# Patient Record
Sex: Female | Born: 1995 | Race: Black or African American | Hispanic: No | Marital: Single | Smoking: Never smoker
Health system: Southern US, Community
[De-identification: ages and names within clinical notes are randomized; demographics above are authoritative.]

## PROBLEM LIST (undated history)

## (undated) DIAGNOSIS — N926 Irregular menstruation, unspecified: Secondary | ICD-10-CM

---

## 2015-05-04 ENCOUNTER — Emergency Department (HOSPITAL_COMMUNITY)
Admission: EM | Admit: 2015-05-04 | Discharge: 2015-05-04 | Disposition: A | Discharge status: Home / Self Care | Payer: Medicaid Other | Attending: Emergency Medicine | Admitting: Emergency Medicine

## 2015-05-04 ENCOUNTER — Encounter (HOSPITAL_COMMUNITY): Payer: Self-pay | Admitting: Emergency Medicine

## 2015-05-04 DIAGNOSIS — N644 Mastodynia: Secondary | ICD-10-CM | POA: Insufficient documentation

## 2015-05-04 MED ORDER — NAPROXEN 500 MG PO TABS: 500.0000 mg | ORAL_TABLET | Freq: Two times a day (BID) | ORAL | Status: DC

## 2015-05-04 MED ORDER — CEPHALEXIN 500 MG PO CAPS: 500.0000 mg | ORAL_CAPSULE | Freq: Four times a day (QID) | ORAL | Status: DC

## 2015-05-04 MED ORDER — NAPROXEN 500 MG PO TABS
500.0000 mg | ORAL_TABLET | Freq: Once | ORAL | Status: AC
  Administered 2015-05-04: 500 mg via ORAL
  Filled 2015-05-04: qty 1
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
## 2015-05-04 NOTE — Discharge Instructions (Signed)
Breast Tenderness Follow up with [facility], women's outpatient clinic, or use the resource guide below to find a primary physician. Breast tenderness is a common problem for women of all ages. Breast tenderness may cause mild discomfort to severe pain. It has a variety of causes. Your health care provider will find out the likely cause of your breast tenderness by examining your breasts, asking you about symptoms, and ordering some tests. Breast tenderness usually does not mean you have breast cancer. HOME CARE INSTRUCTIONS  Breast tenderness often can be handled at home. You can try:  Getting fitted for a new bra that provides more support, especially during exercise.  Wearing a more supportive bra or sports bra while sleeping when your breasts are very tender.  If you have a breast injury, apply ice to the area:  Put ice in a plastic bag.  Place a towel between your skin and the bag.  Leave the ice on for 20 minutes, 2-3 times a day.  If your breasts are too full of milk as a result of breastfeeding, try:  Expressing milk either by hand or with a breast pump.  Applying a warm compress to the breasts for relief.  Taking over-the-counter pain relievers, if approved by your health care provider.  Taking other medicines that your health care provider prescribes. These may include antibiotic medicines or birth control pills. Over the long term, your breast tenderness might be eased if you:  Cut down on caffeine.  Reduce the amount of fat in your diet. Keep a log of the days and times when your breasts are most tender. This will help you and your health care provider find the cause of the tenderness and how to relieve it. Also, learn how to do breast exams at home. This will help you notice if you have an unusual growth or lump that could cause tenderness. SEEK MEDICAL CARE IF:   Any part of your breast is hard, red, and hot to the touch. This could be a sign of  infection.  Fluid is coming out of your nipples (and you are not breastfeeding). Especially watch for blood or pus.  You have a fever as well as breast tenderness.  You have a new or painful lump in your breast that remains after your menstrual period ends.  You have tried to take care of the pain at home, but it has not gone away.  Your breast pain is getting worse, or the pain is making it hard to do the things you usually do during your day. Document Released: 07/20/2008 Document Revised: 04/09/2013 Document Reviewed: 03/06/2013 [facility] Patient Information 2015 [place], [place]. This information is not intended to replace advice given to you by your health care provider. Make sure you discuss any questions you have with your health care provider.
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
## 2015-05-04 NOTE — ED Notes (Signed)
Pt complains of left breast pain. Tender to touch.

## 2015-07-22 ENCOUNTER — Emergency Department (HOSPITAL_COMMUNITY)
Admission: EM | Admit: 2015-07-22 | Discharge: 2015-07-22 | Disposition: A | Discharge status: Home / Self Care | Payer: Medicaid Other | Attending: Emergency Medicine | Admitting: Emergency Medicine

## 2015-07-22 ENCOUNTER — Encounter (HOSPITAL_COMMUNITY): Payer: Self-pay

## 2015-07-22 DIAGNOSIS — Z792 Long term (current) use of antibiotics: Secondary | ICD-10-CM | POA: Diagnosis not present

## 2015-07-22 DIAGNOSIS — Z79899 Other long term (current) drug therapy: Secondary | ICD-10-CM | POA: Diagnosis not present

## 2015-07-22 DIAGNOSIS — Z3202 Encounter for pregnancy test, result negative: Secondary | ICD-10-CM | POA: Insufficient documentation

## 2015-07-22 DIAGNOSIS — R110 Nausea: Secondary | ICD-10-CM

## 2015-07-22 DIAGNOSIS — Z791 Long term (current) use of non-steroidal anti-inflammatories (NSAID): Secondary | ICD-10-CM | POA: Insufficient documentation

## 2015-07-22 DIAGNOSIS — N912 Amenorrhea, unspecified: Secondary | ICD-10-CM

## 2015-07-22 DIAGNOSIS — I10 Essential (primary) hypertension: Secondary | ICD-10-CM | POA: Insufficient documentation

## 2015-07-22 DIAGNOSIS — R1030 Lower abdominal pain, unspecified: Secondary | ICD-10-CM | POA: Diagnosis present

## 2015-07-22 HISTORY — DX: Irregular menstruation, unspecified: N92.6

## 2015-07-22 LAB — URINALYSIS, ROUTINE W REFLEX MICROSCOPIC
Bilirubin Urine: NEGATIVE
GLUCOSE, UA: NEGATIVE mg/dL
HGB URINE DIPSTICK: NEGATIVE
Ketones, ur: NEGATIVE mg/dL
Leukocytes, UA: NEGATIVE
Nitrite: NEGATIVE
Protein, ur: NEGATIVE mg/dL
SPECIFIC GRAVITY, URINE: 1.018 (ref 1.005–1.030)
pH: 6.5 (ref 5.0–8.0)

## 2015-07-22 LAB — POC URINE PREG, ED: Preg Test, Ur: NEGATIVE

## 2015-07-22 MED ORDER — IBUPROFEN 800 MG PO TABS: 800.0000 mg | ORAL_TABLET | Freq: Three times a day (TID) | ORAL | Status: DC

## 2015-07-22 NOTE — Discharge Instructions (Signed)
°Menstruation °Menstruation is the monthly passing of blood, tissue, fluid, and mucus. It is also known as a period. Your body is shedding the lining of the uterus. The flow of blood usually occurs during 3-7 consecutive days each month. Hormones control the menstrual cycle. Hormones are a chemical substance produced by endocrine glands in the body to regulate different bodily functions. °The first menstrual period may start any time between age 20 years to 16 years. However, it usually starts around age 12 years. Some girls have regular monthly menstrual cycles right from the beginning. However, it is not unusual to have only a couple of drops of blood or spotting when you first start menstruating. It is also not unusual to have two periods a month or miss a month or two when first starting your periods. °SYMPTOMS  °· Mild to moderate abdominal cramps. °· Aching or pain in the lower back area. °Symptoms may occur 5-10 days before your menstrual period starts. These symptoms are referred to as premenstrual syndrome (PMS). These symptoms can include: °· Headache. °· Breast tenderness and swelling. °· Bloating. °· Tiredness (fatigue). °· Mood changes. °· Craving for certain foods. °These are normal signs and symptoms and can vary in severity. To help relieve these problems, ask your caregiver if you can take over-the-counter medications for pain or discomfort. If the symptoms are not controllable, see your caregiver for help.  °HORMONES INVOLVED IN MENSTRUATION °Menstruation comes about because of hormones produced by the pituitary gland in the brain and the ovaries that affect the uterine lining. °First, the pituitary gland in the brain produces the hormone follicle stimulating hormone (FSH). FSH stimulates the ovaries to produce estrogen, which thickens the uterine lining and begins to develop an egg in the ovary. About 14 days later, the pituitary gland produces another hormone called luteinizing hormone (LH). LH  causes the egg to come out of a sac in the ovary (ovulation). The empty sac on the ovary called the corpus luteum is stimulated by another hormone from the pituitary gland called luteotropin. The corpus luteum begins to produce the estrogen and progesterone hormone. The progesterone hormone prepares the lining of the uterus to have the fertilized egg (egg combined with sperm) attach to the lining of the uterus and begin to develop into a fetus. If the egg is not fertilized, the corpus luteum stops producing estrogen and progesterone, it disappears, the lining of the uterus sloughs off and a menstrual period begins. Then the menstrual cycle starts all over again and will continue monthly unless pregnancy occurs or menopause begins. °The secretion of hormones is complex. Various parts of the body become involved in many chemical activities. Female sex hormones have other functions in a woman's body as well. Estrogen increases a woman's sex drive (libido). It naturally helps body get rid of fluids (diuretic). It also aids in the process of building new bone. Therefore, maintaining hormonal health is essential to all levels of a woman's well being. These hormones are usually present in normal amounts and cause you to menstruate. It is the relationship between the (small) levels of the hormones that is critical. When the balance is upset, menstrual irregularities can occur. °HOW DOES THE MENSTRUAL CYCLE HAPPEN? °· Menstrual cycles vary in length from 21-35 days with an average of 29 days. The cycle begins on the first day of bleeding. At this time, the pituitary gland in the brain releases FSH that travels through the bloodstream to the ovaries. The FSH stimulates the follicles in   the ovaries. This prepares the body for ovulation that occurs around the 14th day of the cycle. The ovaries produce estrogen, and this makes sure conditions are right in the uterus for implantation of the fertilized egg. °· When the levels of  estrogen reach a high enough level, it signals the gland in the brain (pituitary gland) to release a surge of LH. This causes the release of the ripest egg from its follicle (ovulation). Usually only one follicle releases one egg, but sometimes more than one follicle releases an egg especially when stimulating the ovaries for in vitro fertilization. The egg can then be collected by either fallopian tube to await fertilization. The burst follicle within the ovary that is left behind is now called the corpus luteum or "yellow body." The corpus luteum continues to give off (secrete) reduced amounts of estrogen. This closes and hardens the cervix. It dries up the mucus to the naturally infertile condition. °· The corpus luteum also begins to give off greater amounts of progesterone. This causes the lining of the uterus (endometrium) to thicken even more in preparation for the fertilized egg. The egg is starting to journey down from the fallopian tube to the uterus. It also signals the ovaries to stop releasing eggs. It assists in returning the cervical mucus to its infertile state. °· If the egg implants successfully into the womb lining and pregnancy occurs, progesterone levels will continue to raise. It is often this hormone that gives some pregnant women a feeling of well being, like a "natural high." Progesterone levels drop again after childbirth. °· If fertilization does not occur, the corpus luteum dies, stopping the production of hormones. This sudden drop in progesterone causes the uterine lining to break down, accompanied by blood (menstruation). °· This starts the cycle back at day 1. The whole process starts all over again. Woman go through this cycle every month from puberty to menopause. Women have breaks only for pregnancy and breastfeeding (lactation), unless the woman has health problems that affect the female hormone system or chooses to use oral contraceptives to have unnatural menstrual  periods. °HOME CARE INSTRUCTIONS  °· Keep track of your periods by using a calendar. °· If you use tampons, get the least absorbent to avoid toxic shock syndrome. °· Do not leave tampons in the vagina over night or longer than 6 hours. °· Wear a sanitary pad over night. °· Exercise 3-5 times a week or more. °· Avoid foods and drinks that you know will make your symptoms worse before or during your period. °SEEK MEDICAL CARE IF:  °· You develop a fever with your period. °· Your periods are lasting more than 7 days. °· Your period is so heavy that you have to change pads or tampons every 30 minutes. °· You develop clots with your period and never had clots before. °· You cannot get relief from over-the-counter medication for your symptoms. °· Your period has not started, and it has been longer than 35 days. °  °This information is not intended to replace advice given to you by your health care provider. Make sure you discuss any questions you have with your health care provider. °  °Document Released: 07/28/2002 Document Revised: 04/28/2015 Document Reviewed: 03/06/2013 °Elsevier Interactive Patient Education ©2016 [employer] ° ° °

## 2015-07-22 NOTE — ED Provider Notes (Signed)
CSN: [id number]     Arrival date & time 07/22/15  1148 History   First MD Initiated Contact with Patient 07/22/15 1237     Chief Complaint  Patient presents with  . Nausea    X3 DAYS  . Abdominal Pain    SINCE THIS AM     (Consider location/radiation/quality/duration/timing/severity/associated sxs/prior Treatment) HPI Patient poor she's been having intermittent nausea. She also reports having missed periods. She reports she's had intermittent cramping lower abdominal pain. She denies abnormal vaginal discharge. Past Medical History  Diagnosis Date  . Hypertension   . Irregular menses    History reviewed. No pertinent past surgical history. History reviewed. No pertinent family history. Social History  Substance Use Topics  . Smoking status: Never Smoker   . Smokeless tobacco: None  . Alcohol Use: No   OB History    No data available     Review of Systems Constitutional: No fevers no chills Respiratory: No cough or short of breath GU: Frequent urination no abnormal vaginal discharge.   Allergies  Review of patient's allergies indicates no known allergies.  Home Medications   Prior to Admission medications   Medication Sig Start Date End Date Taking? Authorizing Provider  BIOTIN PO Take 1 tablet by mouth daily.   Yes Historical Provider, MD  cephALEXin (KEFLEX) 500 MG capsule Take 1 capsule (500 mg total) by mouth 4 (four) times daily. 05/04/15   Hanna Patel-Mills, PA-C  ibuprofen (ADVIL,MOTRIN) 800 MG tablet Take 1 tablet (800 mg total) by mouth 3 (three) times daily. 07/22/15   Arby BarretteMarcy Gloria Lambertson, MD  naproxen (NAPROSYN) 500 MG tablet Take 1 tablet (500 mg total) by mouth 2 (two) times daily. 05/04/15   Hanna Patel-Mills, PA-C   BP 117/95 mmHg  Pulse 86  Temp(Src) 98.3 F (36.8 C) (Oral)  Resp 18  SpO2 100%  LMP 04/12/2015 Physical Exam  Constitutional:  Patient is morbidly obese and she is alert and nontoxic. No acute distress.  HENT:  Head: Normocephalic and  atraumatic.  Eyes: EOM are normal.  Cardiovascular: Normal rate, normal heart sounds and intact distal pulses.   Pulmonary/Chest: Effort normal and breath sounds normal.  Abdominal: Soft. Bowel sounds are normal. She exhibits no distension. There is no tenderness.  Musculoskeletal: Normal range of motion.  Neurological: She is alert. Coordination normal.  Skin: Skin is warm and dry.  Psychiatric: She has a normal mood and affect.    ED Course  Procedures (including critical care time) Labs Review Labs Reviewed  URINALYSIS, ROUTINE W REFLEX MICROSCOPIC (NOT [facility])  POC URINE PREG, ED    Imaging Review No results found. I have personally reviewed and evaluated these images and lab results as part of my medical decision-making.   EKG Interpretation None      MDM   Final diagnoses:  Amenorrhea  Nausea   Patient was her main reason coming to the emergency department with concern for pregnancy. She declines pelvic examination at this time. She does not express concern for STD. She reports she can follow up the health department for that.    Arby BarretteMarcy Betzabe Bevans, MD 07/22/15 1341

## 2015-07-22 NOTE — ED Notes (Signed)
PT C/O NAUSEA X3 DAYS. PT ALSO C/O LOWER ABDOMINAL CRAMPING SINCE THIS AM WITH DIARRHEA. PT STATES SHE HAS MISSED HER MENSTRUAL FOR OVER A MONTH. ALSO C/O FREQUENT URINATION. DENIES VAGINAL BLEEDING OR D/C.

## 2015-09-05 ENCOUNTER — Encounter (HOSPITAL_COMMUNITY): Payer: Self-pay | Admitting: Family Medicine

## 2015-09-05 ENCOUNTER — Emergency Department (HOSPITAL_COMMUNITY)
Admission: EM | Admit: 2015-09-05 | Discharge: 2015-09-06 | Disposition: A | Discharge status: Home / Self Care | Payer: Medicaid Other | Attending: Emergency Medicine | Admitting: Emergency Medicine

## 2015-09-05 DIAGNOSIS — Z792 Long term (current) use of antibiotics: Secondary | ICD-10-CM | POA: Insufficient documentation

## 2015-09-05 DIAGNOSIS — R0600 Dyspnea, unspecified: Secondary | ICD-10-CM | POA: Diagnosis not present

## 2015-09-05 DIAGNOSIS — R079 Chest pain, unspecified: Secondary | ICD-10-CM | POA: Diagnosis not present

## 2015-09-05 DIAGNOSIS — Z8742 Personal history of other diseases of the female genital tract: Secondary | ICD-10-CM | POA: Diagnosis not present

## 2015-09-05 DIAGNOSIS — Z791 Long term (current) use of non-steroidal anti-inflammatories (NSAID): Secondary | ICD-10-CM | POA: Diagnosis not present

## 2015-09-05 DIAGNOSIS — I10 Essential (primary) hypertension: Secondary | ICD-10-CM | POA: Diagnosis not present

## 2015-09-05 DIAGNOSIS — R110 Nausea: Secondary | ICD-10-CM | POA: Diagnosis not present

## 2015-09-05 DIAGNOSIS — R42 Dizziness and giddiness: Secondary | ICD-10-CM | POA: Insufficient documentation

## 2015-09-05 DIAGNOSIS — Z79899 Other long term (current) drug therapy: Secondary | ICD-10-CM | POA: Diagnosis not present

## 2015-09-05 DIAGNOSIS — R0602 Shortness of breath: Secondary | ICD-10-CM | POA: Diagnosis present

## 2015-09-05 NOTE — ED Notes (Signed)
Pt is complaining of shortness of breath. Pt reports it started when she woke up around 3-4am that morning. Reports chest tightness. No radiation of chest pain.

## 2015-09-06 ENCOUNTER — Emergency Department (HOSPITAL_COMMUNITY): Payer: Medicaid Other

## 2015-09-06 LAB — BASIC METABOLIC PANEL
ANION GAP: 7 (ref 5–15)
BUN: 8 mg/dL (ref 6–20)
CHLORIDE: 106 mmol/L (ref 101–111)
CO2: 25 mmol/L (ref 22–32)
Calcium: 8.8 mg/dL — ABNORMAL LOW (ref 8.9–10.3)
Creatinine, Ser: 0.67 mg/dL (ref 0.44–1.00)
GFR calc non Af Amer: >60 mL/min (ref >60)
Glucose, Bld: 104 mg/dL — ABNORMAL HIGH (ref 65–99)
POTASSIUM: 3.4 mmol/L — AB (ref 3.5–5.1)
SODIUM: 138 mmol/L (ref 135–145)

## 2015-09-06 LAB — CBC
HEMATOCRIT: 37.8 % (ref 36.0–46.0)
Hemoglobin: 12.8 g/dL (ref 12.0–15.0)
MCH: 26.9 pg (ref 26.0–34.0)
MCHC: 33.9 g/dL (ref 30.0–36.0)
MCV: 79.4 fL (ref 78.0–100.0)
Platelets: 520 10*3/uL — ABNORMAL HIGH (ref 150–400)
RBC: 4.76 MIL/uL (ref 3.87–5.11)
RDW: 12.9 % (ref 11.5–15.5)
WBC: 6.5 10*3/uL (ref 4.0–10.5)

## 2015-09-06 LAB — I-STAT TROPONIN, ED: Troponin i, poc: 0 ng/mL (ref 0.00–0.08)

## 2015-09-06 NOTE — ED Notes (Signed)
Bed: WA21 Expected date:  Expected time:  Means of arrival:  Comments: 

## 2015-09-06 NOTE — ED Provider Notes (Signed)
CSN: [id number]     Arrival date & time 09/05/15  2331 History  By signing my name below, I, [name], attest that this documentation has been prepared under the direction and in the presence of Zadie Rhineonald Skylar Flynt, MD. Electronically Signed: [name], Scribe. 09/06/2015. 1:12 AM.   Chief Complaint  Patient presents with  . Shortness of Breath   Patient is a 20 y.o. female presenting with shortness of breath. The history is provided by the patient. No language interpreter was used.  Shortness of Breath Severity:  Mild Onset quality:  Sudden Duration:  2 days Timing:  Intermittent Progression:  Unchanged Chronicity:  New Relieved by:  None tried Worsened by:  Deep breathing Ineffective treatments:  None tried Associated symptoms: chest pain   Associated symptoms: no abdominal pain, no cough, no fever, no syncope and no vomiting     HPI Comments: Renee Sandoval is a 20 y.o. female who presents to the Emergency Department complaining of intermittent SOB with associated dizziness and nausea onset yesterday morning which occurred again tonight when she was about to go to sleep. Pt notes that her SOB is worse with deep respirations and states that it sometimes wakes her up out of her sleep. She also reports intermittent left sided chest pain and states that she has experienced similar symptoms in the past. Pt states that they recently got a dog and is unsure if she may be allergic to the dog. Pt notes that she does not take any medications and also reports a family hx of asthma. Pt denies vomiting, cough, back pain, abdominal pain, swelling of her arms or legs, and syncope. She also denies recent travel, recent surgeries, hx of smoking, heart disease, drug use and asthma. She is healthy otherwise and states that she currently feels fine.   No hemoptysis Does not take any estrogen products She does not smoke Past Medical History  Diagnosis Date  . Hypertension   . Irregular  menses    History reviewed. No pertinent past surgical history. History reviewed. No pertinent family history. Social History  Substance Use Topics  . Smoking status: Never Smoker   . Smokeless tobacco: None  . Alcohol Use: No   OB History    No data available     Review of Systems  Constitutional: Negative for fever.  Respiratory: Positive for shortness of breath. Negative for cough.   Cardiovascular: Positive for chest pain. Negative for leg swelling and syncope.  Gastrointestinal: Positive for nausea. Negative for vomiting and abdominal pain.  Musculoskeletal: Negative for back pain.  Neurological: Positive for dizziness. Negative for syncope.  All other systems reviewed and are negative.  Allergies  Review of patient's allergies indicates no known allergies.  Home Medications   Prior to Admission medications   Medication Sig Start Date End Date Taking? Authorizing Provider  BIOTIN PO Take 1 tablet by mouth daily.    Historical Provider, MD  cephALEXin (KEFLEX) 500 MG capsule Take 1 capsule (500 mg total) by mouth 4 (four) times daily. 05/04/15   Hanna Patel-Mills, PA-C  ibuprofen (ADVIL,MOTRIN) 800 MG tablet Take 1 tablet (800 mg total) by mouth 3 (three) times daily. 07/22/15   Arby BarretteMarcy Pfeiffer, MD  naproxen (NAPROSYN) 500 MG tablet Take 1 tablet (500 mg total) by mouth 2 (two) times daily. 05/04/15   Hanna Patel-Mills, PA-C   BP 126/78 mmHg  Pulse 92  Temp(Src) 97.9 F (36.6 C) (Oral)  Resp 17  Ht 5\' 5"  (1.651 m)  SpO2 100%  LMP 08/19/2015 Physical Exam CONSTITUTIONAL: Well developed/well nourished HEAD: Normocephalic/atraumatic EYES: EOMI/PERRL ENMT: Mucous membranes moist NECK: supple no meningeal signs SPINE/BACK:entire spine nontender CV: S1/S2 noted, no murmurs/rubs/gallops noted LUNGS: Lungs are clear to auscultation bilaterally, no apparent distress ABDOMEN: soft, nontender, no rebound or guarding, bowel sounds noted throughout abdomen GU:no cva  tenderness NEURO: Pt is awake/alert/appropriate, moves all extremitiesx4.  No facial droop.   EXTREMITIES: pulses normal/equal, full ROM, no calf tenderness or edema  SKIN: warm, color normal PSYCH: no abnormalities of mood noted, alert and oriented to situation  ED Course  Procedures  DIAGNOSTIC STUDIES:    Oxygen Saturation is 100% on RA, normal by my interpretation.   COORDINATION OF CARE:  12:49 AM Will order chest xray. Discussed treatment plan with pt at bedside and pt agreed to plan.  Pt well appearing No symptoms at this time Workup in the ED unremarkable She appears PERC negative Stable for d/c home  Labs Review Labs Reviewed  BASIC METABOLIC PANEL - Abnormal; Notable for the following:    Potassium 3.4 (*)    Glucose, Bld 104 (*)    Calcium 8.8 (*)    All other components within normal limits  CBC - Abnormal; Notable for the following:    Platelets 520 (*)    All other components within normal limits  I-STAT TROPOININ, ED    Imaging Review Dg Chest 2 View  09/06/2015  CLINICAL DATA:  20 year old female with shortness of breath EXAM: CHEST  2 VIEW COMPARISON:  None. FINDINGS: The heart size and mediastinal contours are within normal limits. Both lungs are clear. The visualized skeletal structures are unremarkable. IMPRESSION: No active cardiopulmonary disease. Electronically Signed   By: Elgie Collard M.D.   On: 09/06/2015 01:15   I have personally reviewed and evaluated these images and lab results as part of my medical decision-making.   EKG Interpretation   Date/Time:  Sunday September 05 2015 23:46:13 EST Ventricular Rate:  94 PR Interval:  159 QRS Duration: 92 QT Interval:  343 QTC Calculation: 429 R Axis:   56 Text Interpretation:  Sinus rhythm Normal ECG No previous ECGs available  Confirmed by Bebe Shaggy  MD, Dorinda Hill ([id number]) on 09/05/2015 11:50:52 PM      MDM   Final diagnoses:  Dyspnea    Nursing notes including past medical history and  social history reviewed and considered in documentation xrays/imaging reviewed by myself and considered during evaluation Labs/vital reviewed myself and considered during evaluation   I personally performed the services described in this documentation, which was scribed in my presence. The recorded information has been reviewed and is accurate.        Zadie Rhine, MD 09/06/15 [phone]

## 2015-10-10 ENCOUNTER — Emergency Department (HOSPITAL_COMMUNITY)
Admission: EM | Admit: 2015-10-10 | Discharge: 2015-10-10 | Disposition: A | Discharge status: Home / Self Care | Payer: Medicaid Other | Attending: Emergency Medicine | Admitting: Emergency Medicine

## 2015-10-10 ENCOUNTER — Encounter (HOSPITAL_COMMUNITY): Payer: Self-pay | Admitting: Emergency Medicine

## 2015-10-10 DIAGNOSIS — M791 Myalgia: Secondary | ICD-10-CM | POA: Insufficient documentation

## 2015-10-10 DIAGNOSIS — R109 Unspecified abdominal pain: Secondary | ICD-10-CM | POA: Diagnosis not present

## 2015-10-10 DIAGNOSIS — R51 Headache: Secondary | ICD-10-CM | POA: Insufficient documentation

## 2015-10-10 DIAGNOSIS — R05 Cough: Secondary | ICD-10-CM | POA: Insufficient documentation

## 2015-10-10 DIAGNOSIS — R6889 Other general symptoms and signs: Secondary | ICD-10-CM

## 2015-10-10 DIAGNOSIS — R110 Nausea: Secondary | ICD-10-CM | POA: Diagnosis not present

## 2015-10-10 DIAGNOSIS — R0981 Nasal congestion: Secondary | ICD-10-CM | POA: Insufficient documentation

## 2015-10-10 DIAGNOSIS — J3489 Other specified disorders of nose and nasal sinuses: Secondary | ICD-10-CM | POA: Diagnosis not present

## 2015-10-10 DIAGNOSIS — J029 Acute pharyngitis, unspecified: Secondary | ICD-10-CM | POA: Insufficient documentation

## 2015-10-10 LAB — INFLUENZA PANEL BY PCR (TYPE A & B)
H1N1 flu by pcr: NOT DETECTED
INFLAPCR: NEGATIVE
INFLBPCR: NEGATIVE

## 2015-10-10 MED ORDER — OXYMETAZOLINE HCL 0.05 % NA SOLN: 1.0000 | Freq: Two times a day (BID) | NASAL | Status: DC

## 2015-10-10 MED ORDER — BENZONATATE 100 MG PO CAPS: 200.0000 mg | ORAL_CAPSULE | Freq: Two times a day (BID) | ORAL | Status: DC | PRN

## 2015-10-10 NOTE — ED Provider Notes (Signed)
CSN: [id number]     Arrival date & time 10/10/15  4098 History   First MD Initiated Contact with Patient 10/10/15 1024     Chief Complaint  Patient presents with  . Generalized Body Aches  . Cough     (Consider location/radiation/quality/duration/timing/severity/associated sxs/prior Treatment) HPI   Patient is a 20 year old female with past medical history of hypertension who presents to the ED with multiple complaints. Patient reports having productive cough (yellow sputum), rhinorrhea, nasal congestion, body aches, chest congestion, sore throat, onset 4 days. She also reports having an intermittent diffuse aching headache that is relieved with Tylenol. Patient also reports having diffuse intermittent abdominal cramping with associated nausea, denies any aggravating or alleviating factors. She notes the cramping feel similar to menstrual cramps and states that she is about to start her period in a few days. Pt denies fever, neck stiffness, visual changes, photophobia, chest pain, wheezing, hemoptysis, vomiting, diarrhea, urinary symptoms, vaginal bleeding, vaginal discharge, numbness, tingling, weakness, seizures, syncope.  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 Patient denies getting the flu shot this year.    Past Medical History  Diagnosis Date  . Hypertension   . Irregular menses    History reviewed. No pertinent past surgical history. History reviewed. No pertinent family history. Social History  Substance Use Topics  . Smoking status: Never Smoker   . Smokeless tobacco: None  . Alcohol Use: No   OB History    No data available     Review of Systems  HENT: Positive for congestion, rhinorrhea and sore throat.   Respiratory: Positive for cough.   Gastrointestinal: Positive for nausea and abdominal pain.  Musculoskeletal: Positive for myalgias (generalized).  Neurological: Positive  for headaches.  All other systems reviewed and are negative.     Allergies  Review of patient's allergies indicates no known allergies.  Home Medications   Prior to Admission medications   Medication Sig Start Date End Date Taking? Authorizing Provider  acetaminophen (TYLENOL) 500 MG tablet Take 1,000 mg by mouth every 6 (six) hours as needed for moderate pain.   Yes Historical Provider, MD  benzonatate (TESSALON) 100 MG capsule Take 2 capsules (200 mg total) by mouth 2 (two) times daily as needed for cough. 10/10/15   Barrett Henle, PA-C  cephALEXin (KEFLEX) 500 MG capsule Take 1 capsule (500 mg total) by mouth 4 (four) times daily. Patient not taking: Reported on 09/06/2015 05/04/15   Catha Gosselin, PA-C  ibuprofen (ADVIL,MOTRIN) 800 MG tablet Take 1 tablet (800 mg total) by mouth 3 (three) times daily. Patient not taking: Reported on 09/06/2015 07/22/15   Arby Barrette, MD  naproxen (NAPROSYN) 500 MG tablet Take 1 tablet (500 mg total) by mouth 2 (two) times daily. Patient not taking: Reported on 09/06/2015 05/04/15   Catha Gosselin, PA-C  oxymetazoline (AFRIN NASAL SPRAY) 0.05 % nasal spray Place 1 spray into both nostrils 2 (two) times daily. 10/10/15   Satira Sark Heliodoro Domagalski, PA-C   BP 134/69 mmHg  Pulse 85  Temp(Src) 98.1 F (36.7 C) (Oral)  Resp 18  SpO2 100% Physical Exam  Constitutional: She is oriented to person, place, and time. She appears well-developed and well-nourished. No distress.  HENT:  Head: Normocephalic and atraumatic.  Left Ear: Tympanic membrane normal.  Nose: Rhinorrhea present. Right sinus exhibits no maxillary sinus tenderness and no frontal sinus tenderness. Left sinus exhibits no maxillary  sinus tenderness and no frontal sinus tenderness.  Mouth/Throat: Uvula is midline and mucous membranes are normal. Posterior oropharyngeal erythema present. No oropharyngeal exudate, posterior oropharyngeal edema or tonsillar abscesses.  Eyes:  Conjunctivae and EOM are normal. Pupils are equal, round, and reactive to light. Right eye exhibits no discharge. Left eye exhibits no discharge. No scleral icterus.  Neck: Normal range of motion. Neck supple.  Cardiovascular: Normal rate, regular rhythm, normal heart sounds and intact distal pulses.   Pulmonary/Chest: Effort normal and breath sounds normal. No respiratory distress. She has no wheezes. She has no rales. She exhibits no tenderness.  Abdominal: Soft. Bowel sounds are normal. She exhibits no distension and no mass. There is no tenderness. There is no rebound and no guarding.  Musculoskeletal: Normal range of motion. She exhibits no edema.  Lymphadenopathy:    She has no cervical adenopathy.  Neurological: She is alert and oriented to person, place, and time. No cranial nerve deficit. Coordination normal.  Skin: Skin is warm and dry. No rash noted. She is not diaphoretic.  Nursing note and vitals reviewed.   ED Course  Procedures (including critical care time) Labs Review Labs Reviewed  INFLUENZA PANEL BY PCR (TYPE A & B, H1N1)    Imaging Review No results found. I have personally reviewed and evaluated these images and lab results as part of my medical decision-making.  Filed Vitals:   10/10/15 0958  BP: 134/69  Pulse: 85  Temp: 98.1 F (36.7 C)  Resp: 18      MDM   Final diagnoses:  Flu-like symptoms    Patient with symptoms consistent with influenza vs viral URI.  Vitals are stable, afever.  No signs of dehydration, tolerating PO's.  Lungs are clear. Due to patient's presentation and physical exam a chest x-ray was not ordered bc likely diagnosis of flu.  Discussed the cost versus benefit of Tamiflu treatment with the patient.  The patient understands that symptoms are greater than the recommended 24-48 hour window of treatment. However, flu panel was obtained due to pt reporting that she must be tested for her work. Patient will be discharged with instructions  to orally hydrate, rest, and use over-the-counter medications such as anti-inflammatories ibuprofen and Aleve for muscle aches and Tylenol for fever.  Patient will also be given a cough suppressant and decongestant. Discussed pending labs and advised the pt on how to view her results. PT given resource guide to follow up with PCP.      Satira Sark [place], [place] 10/10/15 1129  Vanetta Mulders, MD 10/12/15 [phone]

## 2015-10-10 NOTE — ED Notes (Signed)
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 For 4 days pt has had generalized body aches, dry cough, nausea, and nasal drainage.

## 2015-10-10 NOTE — Discharge Instructions (Signed)
Take your medications as prescribed. I recommend taking Tylenol or ibuprofen as prescribed over-the-counter as needed for bodyaches and fever. Continue drinking fluids to remain hydrated. Continue using good hand hygiene to prevent spread of infection. Please follow up with a primary care provider from the Resource Guide provided below in 1 week as needed. Please return to the Emergency Department if symptoms worsen or new onset of uncontrollable fever, vomiting, diarrhea, coughing up blood, chest pain, difficulty breathing, decreased oral intake.    Emergency Department Resource Guide 1) Find a Doctor and Pay Out of Pocket Although you won't have to find out who is covered by your insurance plan, it is a good idea to ask around and get recommendations. You will then need to call the office and see if the doctor you have chosen will accept you as a new patient and what types of options they offer for patients who are self-pay. Some doctors offer discounts or will set up payment plans for their patients who do not have insurance, but you will need to ask so you aren't surprised when you get to your appointment.  2) Contact Your Local Health Department Not all health departments have doctors that can see patients for sick visits, but many do, so it is worth a call to see if yours does. If you don't know where your local health department is, you can check in your phone book. The CDC also has a tool to help you locate your state's health department, and many state websites also have listings of all of their local health departments.  3) Find a Walk-in Clinic If your illness is not likely to be very severe or complicated, you may want to try a walk in clinic. These are popping up all over the country in pharmacies, drugstores, and shopping centers. They're usually staffed by nurse practitioners or physician assistants that have been trained to treat common illnesses and complaints. They're usually fairly  quick and inexpensive. However, if you have serious medical issues or chronic medical problems, these are probably not your best option.  No Primary Care Doctor: - Call Health Connect at  [phone] - they can help you locate a primary care doctor that  accepts your insurance, provides certain services, etc. - Physician Referral Service- [phone]  Chronic Pain Problems: Organization         Address  Phone   Notes  [facility]  [phone] Patients need to be referred by their primary care doctor.   Medication Assistance: Organization         Address  Phone   Notes  [facility] Med[facility] [address] [place] [place], [place] [address] [phone] --Must be a resident of [facility] -- Must have NO insurance coverage whatsoever (no Medicaid/ Medicare, etc.) -- The pt. MUST have a primary care doctor that directs their care regularly and follows them in the community   [facility]  [phone]   [employer]  [phone]    Agencies that provide inexpensive medical care: Organization         Address  Phone   Notes  [facility]  [phone]   [facility]    [phone]   [facility] [address] [place], [place] [address] [phone]   [facility] [address] [place], [place] [phone]   Planned Parenthood    [phone]   [facility]    [phone]   [facility] and [facility]  [address], [place] Phone:  [phone], Fax:  [phone] Hours of Operation:  9 am - 6 pm, M-F.  Also accepts Medicaid/Medicare and self-pay.  [facility] for [place], [address], [place] Phone: [phone], Fax: [phone]. Hours of Operation:  8:30 am - 5:30 pm, M-F.  Also accepts Medicaid and self-pay.  [facility] [address], [place] Phone: [phone]    [place], [place], [place] [phone], Ext. [phone] Mondays & Thursdays: 7-9 AM.  First 15 patients are seen on a first come, first serve basis.    [place] Providers:  Organization         Address  Phone   Notes  [facility] [address],  [phone] Also accepts self-pay patients.  [facility] [address], [place]  [phone]   [place], [address], [place] [phone]   [facility] [address], [place] [phone]   Lucianne Lei [address], [place]   [phone] Only accepts [place] Access [place] patients after they have their name applied to their card.   Self-Pay (no insurance) in [facility]:  Organization         Address  Phone   Notes  Sickle Cell Patients, [facility] Internal Medicine [place] [phone]   [facility] [place] [phone]   [facility] [place]  [place], [place], [place] [phone]   [facility]/Dr. Osei-Bonsu  [address] [place] or [address], [place] [phone] Phone number for both [place] and [place] locations is the same.  [facility] and [facility] [address] [place] [phone]   [facility] [address], [place] or [address] [phone]   [facility] [address] [place] [phone], phone; [phone], fax Sees patients 1st and 3rd Saturday of every month.  Must not qualify for public or private insurance (i.e. Medicaid, Medicare, [place] Health Choice, Veterans' Benefits)  Household income should be no more than 200% of the poverty level The clinic cannot treat you if you are  pregnant or think you are pregnant  Sexually transmitted diseases are not treated at the clinic.    Dental Care: Organization         Address  Phone  Notes  [facility] Department of [facility] [phone] Accepts children up to age 52 who are enrolled in [place] or [place]; pregnant women with a Medicaid card; and children who have applied for Medicaid or [place] Health Choice, but were declined, whose parents can pay a reduced fee at time of service.  [facility] of [facility]  [address], [place] [phone] Accepts children up to age 37 who are enrolled in [place] or [place]; pregnant women with a Medicaid card; and children who have applied for Medicaid or [place] Health Choice, but were declined, whose parents can pay a reduced fee at time of  service.  [place] Adult Dental Access PROGRAM  [place] [phone] Patients are seen by appointment only. Walk-ins are not accepted. [place] will see patients 102 years of age and older. Monday - Tuesday (8am-5pm) Most Wednesdays (8:30-5pm) $30 per visit, cash only  [facility] Adult Dental Access PROGRAM  [address], [facility] [phone] Patients are seen by appointment only. Walk-ins are not accepted. [place] will see patients 28 years of age and older. One Wednesday Evening (Monthly: Volunteer Based).  $30 per visit, cash only  [place]  [phone] for adults; Children under age 61, call Graduate Pediatric Dentistry at [phone]. Children aged 20-14, please call [phone] to request a pediatric application.  Dental services are provided in all areas of dental care including fillings, crowns and bridges, complete and partial dentures, implants, gum treatment, root canals, and extractions. Preventive care is also provided. Treatment is provided to  both adults and children. Patients are selected via a lottery and there is often a waiting list.   [facility] [address], [place]  [phone] [email or web address]   [facility] [address] [place], [place] [phone], Ext. [phone] Second and Fourth Thursday of each month, opens at 6:30 AM; Clinic ends at 9 AM.  Patients are seen on a first-come first-served basis, and a limited number are seen during each clinic.   [facility]  [address] Hillard Danker [place], [place] [phone]   Eligibility Requirements You must have lived in [place], [place], or [place] counties for at least the last three months.   You cannot be eligible for state or federal sponsored [employer], including [employer], [place], or [employer].   You generally cannot be eligible for healthcare insurance through your employer.    How to apply: Eligibility screenings are held every Tuesday and Wednesday afternoon from 1:00 pm until 4:00 pm. You do not need an appointment for the interview!  [facility] [address], [place], [place]   [place] Department  [place]  [phone]    Behavioral Health Resources in the Community: Intensive Outpatient Programs Organization         Address  Phone  Notes  [facility] [address] [place], [place] [phone]   [facility] [address], [place], [place]   ADS: Alcohol & Drug Svcs [address], [place], [place]   [place] [address],  [place], [place] or [phone]   Substance Abuse Resources Organization         Address  Phone  Notes  Alcohol and Drug Services  [phone]   [place]  [phone]   [place]   Chinita Pester  [phone]   Residential & Outpatient Substance Abuse Program  [phone]   Psychological Services Organization         Address  Phone  Notes  [facility]  [place]  [phone]   [place] [address], [place] [phone] or [phone]    Mobile Crisis Teams Organization         Address  Phone  Notes  [facility], Mobile Crisis Care Unit  [phone]   [facility]  [address] [place], [place] [phone]   [facility] [address] [place] [phone]    Self-Help/Support Groups Organization         Address  Phone             Notes  [facility] [place] - variety of support groups  [phone]- [phone] Call for more information  Narcotics Anonymous (NA), [facility] [address], [employer] [place]  2 meetings at this location   Statistician         Address  Phone  Notes  [facility] [address],    [place]  [phone]   [facility]  [address] [place] [address]75643, [place], [place] [phone]   [facility] [address] [place], [place] [phone] Admissions: 8am-3pm M-F  [facility] [address],    [place], [place] [phone]   [facility] [address] [place], [place], [place] [phone]   [facility] [address]  [place], [place] [phone]   [facility] - Intensive Outpatient [address]urell [address], [place], [place] [phone]   [facility] (Addiction Recovery [facility] [address] [place].,  [place], [place] [phone] or [phone]   [facility] ([facility]) [address] [place], [place] [phone] Accepts Medicaid  [facility] [address]  [place] [phone] Substance Abuse/Addiction Treatment   [facility] Organization         Address  Phone  Notes  [facility]  [phone]   Angie Fava, PhD [address] Ervin Knack [place], [place]   [phone] or [phone]   [facility] Behavioral   [address] [place], [place] [phone]   [facility] [address] [place], [place] [phone] Insurance/Medicaid/sponsorship through [facility] and Families [address]                                    [place], [place] [phone] Therapy/tele-psych/case  [facility] [address], [place] [phone]    Dr. Lolly Mustache  [phone]   [facility]  [employer] [facility] Dept. 1) [address] [place] 2) [address] [place] 3)  [address], [place] [phone]  [phone]   [facility] Child Abuse Hotline 724-016-6717 or [phone] (After Hours)

## 2015-10-26 ENCOUNTER — Encounter (HOSPITAL_COMMUNITY): Payer: Self-pay

## 2015-10-26 ENCOUNTER — Emergency Department (INDEPENDENT_AMBULATORY_CARE_PROVIDER_SITE_OTHER)
Admission: EM | Admit: 2015-10-26 | Discharge: 2015-10-26 | Disposition: A | Discharge status: Home / Self Care | Payer: Medicaid Other | Source: Home / Self Care | Attending: Family Medicine | Admitting: Family Medicine

## 2015-10-26 DIAGNOSIS — R1031 Right lower quadrant pain: Secondary | ICD-10-CM

## 2015-10-26 LAB — POCT URINALYSIS DIP (DEVICE)
Bilirubin Urine: NEGATIVE
GLUCOSE, UA: NEGATIVE mg/dL
Hgb urine dipstick: NEGATIVE
Ketones, ur: NEGATIVE mg/dL
LEUKOCYTES UA: NEGATIVE
NITRITE: NEGATIVE
Protein, ur: NEGATIVE mg/dL
SPECIFIC GRAVITY, URINE: 1.015 (ref 1.005–1.030)
UROBILINOGEN UA: 0.2 mg/dL (ref 0.0–1.0)
pH: 8.5 — ABNORMAL HIGH (ref 5.0–8.0)

## 2015-10-26 LAB — POCT PREGNANCY, URINE: PREG TEST UR: NEGATIVE

## 2015-10-26 NOTE — Discharge Instructions (Signed)

## 2015-10-26 NOTE — ED Provider Notes (Signed)
CSN: [id number]     Arrival date & time 10/26/15  1639 History   First MD Initiated Contact with Patient 10/26/15 1701     Chief Complaint  Patient presents with  . Abdominal Pain   (Consider location/radiation/quality/duration/timing/severity/associated sxs/prior Treatment) HPI History obtained from patient:  Pt presents with the cc NW:[id number]:abdominal pain Duration of symptoms:2-3 day Treatment prior to arrival: Previous symptoms of this type before: Others at work have been ill.  Pain score: Other symptoms include:frequent urination, flu like symptoms         Past Medical History  Diagnosis Date  . Irregular menses    History reviewed. No pertinent past surgical history. No family history on file. Social History  Substance Use Topics  . Smoking status: Never Smoker   . Smokeless tobacco: Never Used  . Alcohol Use: No   OB History    No data available     Review of Systems Abdominal pain Allergies  Review of patient's allergies indicates no known allergies.  Home Medications   Prior to Admission medications   Medication Sig Start Date End Date Taking? Authorizing Provider  acetaminophen (TYLENOL) 500 MG tablet Take 1,000 mg by mouth every 6 (six) hours as needed for moderate pain.   Yes Historical Provider, MD  benzonatate (TESSALON) 100 MG capsule Take 2 capsules (200 mg total) by mouth 2 (two) times daily as needed for cough. 10/10/15   Barrett HenleNicole Elizabeth Nadeau, PA-C  cephALEXin (KEFLEX) 500 MG capsule Take 1 capsule (500 mg total) by mouth 4 (four) times daily. Patient not taking: Reported on 09/06/2015 05/04/15   Catha GosselinHanna Patel-Mills, PA-C  ibuprofen (ADVIL,MOTRIN) 800 MG tablet Take 1 tablet (800 mg total) by mouth 3 (three) times daily. Patient not taking: Reported on 09/06/2015 07/22/15   Arby BarretteMarcy Pfeiffer, MD  naproxen (NAPROSYN) 500 MG tablet Take 1 tablet (500 mg total) by mouth 2 (two) times daily. Patient not taking: Reported on 09/06/2015 05/04/15   Catha GosselinHanna Patel-Mills,  PA-C  oxymetazoline (AFRIN NASAL SPRAY) 0.05 % nasal spray Place 1 spray into both nostrils 2 (two) times daily. 10/10/15   Barrett HenleNicole Elizabeth Nadeau, PA-C   Meds Ordered and Administered this Visit  Medications - No data to display  BP 150/75 mmHg  Pulse 100  Temp(Src) 98.1 F (36.7 C) (Oral)  Resp 16  SpO2 99%  LMP 09/15/2015 (Exact Date) No data found.   Physical Exam NURSES NOTES AND VITAL SIGNS REVIEWED. CONSTITUTIONAL: Well developed, well nourished, no acute distress ( patient states she is hungry and is eating a pack of cookies at this time.  HEENT: normocephalic, atraumatic, right and left TM's are normal EYES: Conjunctiva normal NECK:normal ROM, supple, no adenopathy PULMONARY:No respiratory distress, normal effort, Lungs: CTAb/l, no wheezes, or increased work of breathing CARDIOVASCULAR: RRR, no murmur ABDOMEN: soft, ND, NT, +'ve BS MUSCULOSKELETAL: Normal ROM of all extremities,  SKIN: warm and dry without rash PSYCHIATRIC: Mood and affect, behavior are normal  ED Course  Procedures (including critical care time)  Labs Review Labs Reviewed  POCT URINALYSIS DIP (DEVICE) - Abnormal; Notable for the following:    pH 8.5 (*)    All other components within normal limits  POCT PREGNANCY, URINE    Imaging Review No results found.   Visual Acuity Review  Right Eye Distance:   Left Eye Distance:   Bilateral Distance:    Right Eye Near:   Left Eye Near:    Bilateral Near:       Discussed UA results with  patient.  No acute problems found at this time. Suggest follow up and return if there are new or worsening of symptoms go to ER.  MDM   1. Right lower quadrant abdominal pain         Tharon Aquas, [place] 10/26/15 1800

## 2015-10-26 NOTE — ED Notes (Signed)
Pt states she has had frequent urination, flu -like symptoms x1 week Pt is having sharp abdominal pain x2 days No medication has been taken for pain No acute distress

## 2016-03-07 DIAGNOSIS — R0789 Other chest pain: Secondary | ICD-10-CM | POA: Insufficient documentation

## 2016-03-07 DIAGNOSIS — Z79899 Other long term (current) drug therapy: Secondary | ICD-10-CM | POA: Insufficient documentation

## 2016-03-07 DIAGNOSIS — R109 Unspecified abdominal pain: Secondary | ICD-10-CM | POA: Insufficient documentation

## 2016-03-08 ENCOUNTER — Emergency Department (HOSPITAL_COMMUNITY)
Admission: EM | Admit: 2016-03-08 | Discharge: 2016-03-08 | Disposition: A | Discharge status: Home / Self Care | Payer: Medicaid Other | Attending: Emergency Medicine | Admitting: Emergency Medicine

## 2016-03-08 ENCOUNTER — Emergency Department (HOSPITAL_COMMUNITY): Payer: Medicaid Other

## 2016-03-08 ENCOUNTER — Encounter (HOSPITAL_COMMUNITY): Payer: Self-pay | Admitting: Emergency Medicine

## 2016-03-08 DIAGNOSIS — R0789 Other chest pain: Secondary | ICD-10-CM

## 2016-03-08 LAB — CBC
HCT: 37.1 % (ref 36.0–46.0)
HEMOGLOBIN: 12.6 g/dL (ref 12.0–15.0)
MCH: 26.9 pg (ref 26.0–34.0)
MCHC: 34 g/dL (ref 30.0–36.0)
MCV: 79.1 fL (ref 78.0–100.0)
PLATELETS: 568 10*3/uL — AB (ref 150–400)
RBC: 4.69 MIL/uL (ref 3.87–5.11)
RDW: 12.9 % (ref 11.5–15.5)
WBC: 6 10*3/uL (ref 4.0–10.5)

## 2016-03-08 LAB — I-STAT TROPONIN, ED: Troponin i, poc: 0 ng/mL (ref 0.00–0.08)

## 2016-03-08 LAB — URINE MICROSCOPIC-ADD ON

## 2016-03-08 LAB — URINALYSIS, ROUTINE W REFLEX MICROSCOPIC
Bilirubin Urine: NEGATIVE
GLUCOSE, UA: NEGATIVE mg/dL
Hgb urine dipstick: NEGATIVE
Ketones, ur: NEGATIVE mg/dL
NITRITE: NEGATIVE
PH: 6.5 (ref 5.0–8.0)
Protein, ur: NEGATIVE mg/dL
SPECIFIC GRAVITY, URINE: 1.017 (ref 1.005–1.030)

## 2016-03-08 LAB — BASIC METABOLIC PANEL
Anion gap: 6 (ref 5–15)
BUN: 9 mg/dL (ref 6–20)
CALCIUM: 9.5 mg/dL (ref 8.9–10.3)
CHLORIDE: 107 mmol/L (ref 101–111)
CO2: 27 mmol/L (ref 22–32)
CREATININE: 0.81 mg/dL (ref 0.44–1.00)
GFR calc non Af Amer: >60 mL/min (ref >60)
GLUCOSE: 90 mg/dL (ref 65–99)
Potassium: 4 mmol/L (ref 3.5–5.1)
Sodium: 140 mmol/L (ref 135–145)

## 2016-03-08 LAB — LIPASE, BLOOD: LIPASE: 18 U/L (ref 11–51)

## 2016-03-08 LAB — POC URINE PREG, ED: Preg Test, Ur: NEGATIVE

## 2016-03-08 MED ORDER — IBUPROFEN 800 MG PO TABS: 800.0000 mg | ORAL_TABLET | Freq: Three times a day (TID) | ORAL | Status: DC

## 2016-03-08 NOTE — ED Provider Notes (Signed)
CSN: [id number]     Arrival date & time 03/07/16  2359 History   First MD Initiated Contact with Patient 03/08/16 0206     Chief Complaint  Patient presents with  . Chest Pain  . Abdominal Pain     (Consider location/radiation/quality/duration/timing/severity/associated sxs/prior Treatment) HPI Comments: Patient presents with complaint of left chest discomfort that started yesterday. It was associated with abdominal discomfort described as "sharp paint hat moves around my abdomen". No nausea, vomiting, fever, cough. She did have mild SOB. CP and SOB have been constant and progressively improving to where, at this time, the patient is essentially asymptomatic.    Patient is a 20 y.o. female presenting with chest pain and abdominal pain. The history is provided by the patient. No language interpreter was used.  Chest Pain Pain location:  L chest Pain quality: aching   Pain radiates to:  Does not radiate Pain radiates to the back: no   Pain severity:  Moderate Onset quality:  Gradual Progression:  Improving Chronicity:  Recurrent Associated symptoms: abdominal pain and shortness of breath   Associated symptoms: no cough, no fever, no nausea, no numbness and not vomiting   Abdominal Pain Associated symptoms: chest pain and shortness of breath   Associated symptoms: no cough, no fever, no nausea and no vomiting     Past Medical History  Diagnosis Date  . Irregular menses    History reviewed. No pertinent past surgical history. No family history on file. Social History  Substance Use Topics  . Smoking status: Never Smoker   . Smokeless tobacco: Never Used  . Alcohol Use: No   OB History    No data available     Review of Systems  Constitutional: Negative for fever.  Respiratory: Positive for shortness of breath. Negative for cough and wheezing.   Cardiovascular: Positive for chest pain.  Gastrointestinal: Positive for abdominal pain. Negative for nausea and vomiting.   Musculoskeletal: Negative for myalgias.  Neurological: Negative for numbness.      Allergies  Review of patient's allergies indicates no known allergies.  Home Medications   Prior to Admission medications   Medication Sig Start Date End Date Taking? Authorizing Provider  acetaminophen (TYLENOL) 500 MG tablet Take 1,000 mg by mouth every 6 (six) hours as needed for moderate pain.    Historical Provider, MD  benzonatate (TESSALON) 100 MG capsule Take 2 capsules (200 mg total) by mouth 2 (two) times daily as needed for cough. 10/10/15   Barrett Henle, PA-C  cephALEXin (KEFLEX) 500 MG capsule Take 1 capsule (500 mg total) by mouth 4 (four) times daily. Patient not taking: Reported on 09/06/2015 05/04/15   Catha Gosselin, PA-C  ibuprofen (ADVIL,MOTRIN) 800 MG tablet Take 1 tablet (800 mg total) by mouth 3 (three) times daily. Patient not taking: Reported on 09/06/2015 07/22/15   Arby Barrette, MD  naproxen (NAPROSYN) 500 MG tablet Take 1 tablet (500 mg total) by mouth 2 (two) times daily. Patient not taking: Reported on 09/06/2015 05/04/15   Catha Gosselin, PA-C  oxymetazoline (AFRIN NASAL SPRAY) 0.05 % nasal spray Place 1 spray into both nostrils 2 (two) times daily. 10/10/15   Satira Sark Nadeau, PA-C   BP 130/78 mmHg  Pulse 76  Resp 18  SpO2 100%  LMP 02/24/2016 (Exact Date) Physical Exam  Constitutional: She appears well-developed and well-nourished. No distress.  HENT:  Head: Normocephalic.  Neck: Normal range of motion. Neck supple.  Cardiovascular: Normal rate and regular rhythm.   Pulmonary/Chest:  Effort normal and breath sounds normal. She has no wheezes. She has no rales. She exhibits tenderness (Mildly tender over left parasternal and left chest wall. ).  Abdominal: Soft. Bowel sounds are normal. There is no tenderness. There is no rebound and no guarding.  Musculoskeletal: Normal range of motion. She exhibits no edema.  Neurological: She is alert. No  cranial nerve deficit.  Skin: Skin is warm and dry. No rash noted.  Psychiatric: She has a normal mood and affect.    ED Course  Procedures (including critical care time) Labs Review Labs Reviewed  CBC - Abnormal; Notable for the following:    Platelets 568 (*)    All other components within normal limits  URINALYSIS, ROUTINE W REFLEX MICROSCOPIC (NOT AT [facility]) - Abnormal; Notable for the following:    Leukocytes, UA TRACE (*)    All other components within normal limits  URINE MICROSCOPIC-ADD ON - Abnormal; Notable for the following:    Squamous Epithelial / LPF 0-5 (*)    Bacteria, UA RARE (*)    All other components within normal limits  BASIC METABOLIC PANEL  LIPASE, BLOOD  I-STAT TROPOININ, ED  POC URINE PREG, ED   Results for orders placed or performed during the hospital encounter of 03/08/16  Basic metabolic panel  Result Value Ref Range   Sodium 140 135 - 145 mmol/L   Potassium 4.0 3.5 - 5.1 mmol/L   Chloride 107 101 - 111 mmol/L   CO2 27 22 - 32 mmol/L   Glucose, Bld 90 65 - 99 mg/dL   BUN 9 6 - 20 mg/dL   Creatinine, Ser 6.96 0.44 - 1.00 mg/dL   Calcium 9.5 8.9 - 29.5 mg/dL   GFR calc non Af Amer >60 >60 mL/min   GFR calc Af Amer >60 >60 mL/min   Anion gap 6 5 - 15  CBC  Result Value Ref Range   WBC 6.0 4.0 - 10.5 K/uL   RBC 4.69 3.87 - 5.11 MIL/uL   Hemoglobin 12.6 12.0 - 15.0 g/dL   HCT 28.4 13.2 - 44.0 %   MCV 79.1 78.0 - 100.0 fL   MCH 26.9 26.0 - 34.0 pg   MCHC 34.0 30.0 - 36.0 g/dL   RDW 10.2 72.5 - 36.6 %   Platelets 568 (H) 150 - 400 K/uL  Urinalysis, Routine w reflex microscopic (not at [facility])  Result Value Ref Range   Color, Urine YELLOW YELLOW   APPearance CLEAR CLEAR   Specific Gravity, Urine 1.017 1.005 - 1.030   pH 6.5 5.0 - 8.0   Glucose, UA NEGATIVE NEGATIVE mg/dL   Hgb urine dipstick NEGATIVE NEGATIVE   Bilirubin Urine NEGATIVE NEGATIVE   Ketones, ur NEGATIVE NEGATIVE mg/dL   Protein, ur NEGATIVE NEGATIVE mg/dL   Nitrite NEGATIVE  NEGATIVE   Leukocytes, UA TRACE (A) NEGATIVE  Lipase, blood  Result Value Ref Range   Lipase 18 11 - 51 U/L  Urine microscopic-add on  Result Value Ref Range   Squamous Epithelial / LPF 0-5 (A) NONE SEEN   WBC, UA 0-5 0 - 5 WBC/hpf   RBC / HPF 0-5 0 - 5 RBC/hpf   Bacteria, UA RARE (A) NONE SEEN  I-stat troponin, ED  Result Value Ref Range   Troponin i, poc 0.00 0.00 - 0.08 ng/mL   Comment 3          POC Urine Pregnancy, ED (do NOT order at [facility])  Result Value Ref Range   Preg Test,  Ur NEGATIVE NEGATIVE     Imaging Review Dg Chest 2 View  03/08/2016  CLINICAL DATA:  Central chest pain which shortness of breath EXAM: CHEST  2 VIEW COMPARISON:  09/06/2015 FINDINGS: Normal heart size and mediastinal contours. No acute infiltrate or edema. No effusion or pneumothorax. No acute osseous findings. IMPRESSION: Negative chest. Electronically Signed   By: Marnee SpringJonathon  Watts M.D.   On: 03/08/2016 00:29   I have personally reviewed and evaluated these images and lab results as part of my medical decision-making.   EKG Interpretation None      MDM   Final diagnoses:  None    1. Chest wall pain  Patient presents with complaint of chest and abdominal pain onset yesterday. Her lab studies and normal, EKG is non-acute/non-ischemic, CXR clear. Discomfort is reproducible and considered indicative of chest wall pain. No risk factors for PE, no tachycardia. Feel she can be discharged home with anti-inflammatory pain medication, and PCP follow up. Return precautions discussed.   Elpidio AnisShari Nida Manfredi, PA-C 03/08/16 [id number]  Azalia BilisKevin Campos, MD 03/08/16 [phone]

## 2016-03-08 NOTE — Discharge Instructions (Signed)
Chest Wall Pain °Chest wall pain is pain in or around the bones and muscles of your chest. Sometimes, an injury causes this pain. Sometimes, the cause may not be known. This pain may take several weeks or longer to get better. °HOME CARE INSTRUCTIONS  °Pay attention to any changes in your symptoms. Take these actions to help with your pain:  °· Rest as told by your health care provider.   °· Avoid activities that cause pain. These include any activities that use your chest muscles or your abdominal and side muscles to lift heavy items.    °· If directed, apply ice to the painful area: °· Put ice in a plastic bag. °· Place a towel between your skin and the bag. °· Leave the ice on for 20 minutes, 2-3 times per day. °· Take over-the-counter and prescription medicines only as told by your health care provider. °· Do not use tobacco products, including cigarettes, chewing tobacco, and e-cigarettes. If you need help quitting, ask your health care provider. °· Keep all follow-up visits as told by your health care provider. This is important. °SEEK MEDICAL CARE IF: °· You have a fever. °· Your chest pain becomes worse. °· You have new symptoms. °SEEK IMMEDIATE MEDICAL CARE IF: °· You have nausea or vomiting. °· You feel sweaty or light-headed. °· You have a cough with phlegm (sputum) or you cough up blood. °· You develop shortness of breath. °  °This information is not intended to replace advice given to you by your health care provider. Make sure you discuss any questions you have with your health care provider. °  °Document Released: 08/07/2005 Document Revised: 04/28/2015 Document Reviewed: 11/02/2014 °Elsevier Interactive Patient Education ©2016 [employer] °Heat Therapy °Heat therapy can help ease sore, stiff, injured, and tight muscles and joints. Heat relaxes your muscles, which may help ease your pain.  °RISKS AND COMPLICATIONS °If you have any of the following conditions, do not use heat therapy unless your  health care provider has approved: °· Poor circulation. °· Healing wounds or scarred skin in the area being treated. °· Diabetes, heart disease, or high blood pressure. °· Not being able to feel (numbness) the area being treated. °· Unusual swelling of the area being treated. °· Active infections. °· Blood clots. °· Cancer. °· Inability to communicate pain. This may include young children and people who have problems with their brain function (dementia). °· Pregnancy. °Heat therapy should only be used on old, pre-existing, or long-lasting (chronic) injuries. Do not use heat therapy on new injuries unless directed by your health care provider. °HOW TO USE HEAT THERAPY °There are several different kinds of heat therapy, including: °· Moist heat pack. °· Warm water bath. °· Hot water bottle. °· Electric heating pad. °· Heated gel pack. °· Heated wrap. °· Electric heating pad. °Use the heat therapy method suggested by your health care provider. Follow your health care provider's instructions on when and how to use heat therapy. °GENERAL HEAT THERAPY RECOMMENDATIONS °· Do not sleep while using heat therapy. Only use heat therapy while you are awake. °· Your skin may turn pink while using heat therapy. Do not use heat therapy if your skin turns red. °· Do not use heat therapy if you have new pain. °· High heat or long exposure to heat can cause burns. Be careful when using heat therapy to avoid burning your skin. °· Do not use heat therapy on areas of your skin that are already irritated, such as with a   rash or sunburn. °SEEK MEDICAL CARE IF: °· You have blisters, redness, swelling, or numbness. °· You have new pain. °· Your pain is worse. °MAKE SURE YOU: °· Understand these instructions. °· Will watch your condition. °· Will get help right away if you are not doing well or get worse. °  °This information is not intended to replace advice given to you by your health care provider. Make sure you discuss any questions you  have with your health care provider. °  °Document Released: 10/30/2011 Document Revised: 08/28/2014 Document Reviewed: 09/30/2013 °Elsevier Interactive Patient Education ©2016 Elsevier Inc. ° °

## 2016-03-08 NOTE — ED Notes (Signed)
Pt. reports mid chest pain and generalized abdominal pain with SOB and nausea onset last night , denies diarrhea or fever .

## 2016-03-11 ENCOUNTER — Emergency Department (HOSPITAL_COMMUNITY)
Admission: EM | Admit: 2016-03-11 | Discharge: 2016-03-11 | Disposition: A | Discharge status: Home / Self Care | Payer: Medicaid Other | Attending: Emergency Medicine | Admitting: Emergency Medicine

## 2016-03-11 ENCOUNTER — Encounter (HOSPITAL_COMMUNITY): Payer: Self-pay

## 2016-03-11 DIAGNOSIS — R519 Headache, unspecified: Secondary | ICD-10-CM

## 2016-03-11 DIAGNOSIS — R51 Headache: Secondary | ICD-10-CM | POA: Insufficient documentation

## 2016-03-11 DIAGNOSIS — Z79899 Other long term (current) drug therapy: Secondary | ICD-10-CM | POA: Insufficient documentation

## 2016-03-11 LAB — CBC
HCT: 39.1 % (ref 36.0–46.0)
Hemoglobin: 13.1 g/dL (ref 12.0–15.0)
MCH: 26.8 pg (ref 26.0–34.0)
MCHC: 33.5 g/dL (ref 30.0–36.0)
MCV: 80 fL (ref 78.0–100.0)
PLATELETS: 533 10*3/uL — AB (ref 150–400)
RBC: 4.89 MIL/uL (ref 3.87–5.11)
RDW: 12.6 % (ref 11.5–15.5)
WBC: 5.4 10*3/uL (ref 4.0–10.5)

## 2016-03-11 LAB — COMPREHENSIVE METABOLIC PANEL
ALBUMIN: 3.8 g/dL (ref 3.5–5.0)
ALT: 15 U/L (ref 14–54)
AST: 14 U/L — AB (ref 15–41)
Alkaline Phosphatase: 67 U/L (ref 38–126)
Anion gap: 7 (ref 5–15)
BUN: 5 mg/dL — ABNORMAL LOW (ref 6–20)
CHLORIDE: 109 mmol/L (ref 101–111)
CO2: 24 mmol/L (ref 22–32)
CREATININE: 0.73 mg/dL (ref 0.44–1.00)
Calcium: 9.5 mg/dL (ref 8.9–10.3)
GFR calc non Af Amer: >60 mL/min (ref >60)
GLUCOSE: 99 mg/dL (ref 65–99)
Potassium: 4 mmol/L (ref 3.5–5.1)
SODIUM: 140 mmol/L (ref 135–145)
Total Bilirubin: 0.7 mg/dL (ref 0.3–1.2)
Total Protein: 7 g/dL (ref 6.5–8.1)

## 2016-03-11 LAB — LIPASE, BLOOD: LIPASE: 20 U/L (ref 11–51)

## 2016-03-11 MED ORDER — DIPHENHYDRAMINE HCL 50 MG/ML IJ SOLN: 25.0000 mg | Freq: Once | INTRAMUSCULAR | Status: DC

## 2016-03-11 MED ORDER — DIPHENHYDRAMINE HCL 50 MG/ML IJ SOLN
25.0000 mg | Freq: Once | INTRAMUSCULAR | Status: AC
  Administered 2016-03-11: 25 mg via INTRAMUSCULAR
  Filled 2016-03-11: qty 1

## 2016-03-11 MED ORDER — KETOROLAC TROMETHAMINE 30 MG/ML IJ SOLN
30.0000 mg | Freq: Once | INTRAMUSCULAR | Status: AC
  Administered 2016-03-11: 30 mg via INTRAMUSCULAR
  Filled 2016-03-11: qty 1

## 2016-03-11 MED ORDER — PROCHLORPERAZINE EDISYLATE 5 MG/ML IJ SOLN
10.0000 mg | Freq: Once | INTRAMUSCULAR | Status: AC
  Administered 2016-03-11: 10 mg via INTRAMUSCULAR
  Filled 2016-03-11: qty 2

## 2016-03-11 NOTE — ED Provider Notes (Signed)
CSN: [id number]     Arrival date & time 03/11/16  1530 History   First MD Initiated Contact with Patient 03/11/16 2004     Chief Complaint  Patient presents with  . Headache  . Nausea  . Chest Pain     (Consider location/radiation/quality/duration/timing/severity/associated sxs/prior Treatment) HPI  Pt presenting with c/o left sided headache and nausea.  She also has ongoing chest wall pain- she was seen for this in the ED 3 days ago- she took ibuprofen without much relief, last dose was yesterday.  Today while at work she developed a gradual throbbing headache associated with nausea.  No focal weakness, no changes in vision or speech, no vomiting.  No fainting.  No fever/chills, no neck pain.  She has not taken anything for her symptoms.  HA was gradual in onset.  There are no other associated systemic symptoms, there are no other alleviating or modifying factors.   Past Medical History  Diagnosis Date  . Irregular menses    History reviewed. No pertinent past surgical history. No family history on file. Social History  Substance Use Topics  . Smoking status: Never Smoker   . Smokeless tobacco: Never Used  . Alcohol Use: No   OB History    No data available     Review of Systems  ROS reviewed and all otherwise negative except for mentioned in HPI    Allergies  Review of patient's allergies indicates no known allergies.  Home Medications   Prior to Admission medications   Medication Sig Start Date End Date Taking? Authorizing Provider  acetaminophen (TYLENOL) 500 MG tablet Take 1,000 mg by mouth every 6 (six) hours as needed for moderate pain.    Historical Provider, MD  benzonatate (TESSALON) 100 MG capsule Take 2 capsules (200 mg total) by mouth 2 (two) times daily as needed for cough. 10/10/15   Barrett Henle, PA-C  cephALEXin (KEFLEX) 500 MG capsule Take 1 capsule (500 mg total) by mouth 4 (four) times daily. Patient not taking: Reported on 09/06/2015 05/04/15    Catha Gosselin, PA-C  ibuprofen (ADVIL,MOTRIN) 800 MG tablet Take 1 tablet (800 mg total) by mouth 3 (three) times daily. 03/08/16   Elpidio Anis, PA-C  naproxen (NAPROSYN) 500 MG tablet Take 1 tablet (500 mg total) by mouth 2 (two) times daily. Patient not taking: Reported on 09/06/2015 05/04/15   Catha Gosselin, PA-C  oxymetazoline (AFRIN NASAL SPRAY) 0.05 % nasal spray Place 1 spray into both nostrils 2 (two) times daily. 10/10/15   Satira Sark Nadeau, PA-C   BP 127/79 mmHg  Pulse 88  Temp(Src) 98.4 F (36.9 C) (Oral)  Resp 20  Ht 5\' 5"  (1.651 m)  Wt 126.735 kg  BMI 46.49 kg/m2  SpO2 100%  LMP 02/24/2016 (Exact Date)  Vitals reviewed Physical Exam  Physical Examination: General appearance - alert, well appearing, and in no distress Mental status - alert, oriented to person, place, and time Eyes - pupils equal and reactive, extraocular eye movements intact Mouth - mucous membranes moist, pharynx normal without lesions Neck - supple, no significant adenopathy Chest - clear to auscultation, no wheezes, rales or rhonchi, symmetric air entry Heart - normal rate, regular rhythm, normal S1, S2, no murmurs, rubs, clicks or gallops Abdomen - soft, nontender, nondistended, no masses or organomegaly Neurological - alert, oriented x 3, cranial nerves 2-12 tested and intact, strength 5/5 in extremities x 4, sensation intact Extremities - peripheral pulses normal, no pedal edema, no clubbing or cyanosis Skin -  normal coloration and turgor, no rashes  ED Course  Procedures (including critical care time) Labs Review Labs Reviewed  COMPREHENSIVE METABOLIC PANEL - Abnormal; Notable for the following:    BUN 5 (*)    AST 14 (*)    All other components within normal limits  CBC - Abnormal; Notable for the following:    Platelets 533 (*)    All other components within normal limits  LIPASE, BLOOD    Imaging Review No results found. I have personally reviewed and evaluated these  images and lab results as part of my medical decision-making.   EKG Interpretation   Date/Time:  Saturday March 11 2016 15:38:30 EDT Ventricular Rate:  85 PR Interval:  158 QRS Duration: 96 QT Interval:  346 QTC Calculation: 411 R Axis:   54 Text Interpretation:  Normal sinus rhythm with sinus arrhythmia Normal ECG  No significant change since last tracing Confirmed by Kurt G Vernon Md Pa  MD, Sayward Horvath  [id number]) on 03/11/2016 8:08:15 PM      MDM   Final diagnoses:  Bad headache   Pt presenting with c/o throbbing left sided headache associated with nausea.  Normal neuro exam.  hA was gradual in onset, doubt SAH, no fever or neck pain- doubt meningitis.  Pt treated with IM meds and felt improvement in her symptoms.  Her EKG remained unchanged- she has ongoing chest wall pain. Discussed dosing of ibuprofen- she can take 800 mg TID for headache and chest wall pain.  Discharged with strict return precautions.  Pt agreeable with plan.    Jerelyn Scott, MD 03/11/16 2255

## 2016-03-11 NOTE — Discharge Instructions (Signed)
Return to the ED with any concerns including changes in vision or speech, weakness in arms or legs, fainting, vomiting and not able to keep down liquids, decreased level of alertness/lethargy, or any other alarming symptoms °

## 2016-03-11 NOTE — ED Notes (Addendum)
Patient here requesting recheck of headache, shortness of breath and sharp chest pain, states she has nausea with same. On arrival NAD, states she is taking ibuprofen without relief. Seen on 7/19 for same and all labs normal

## 2016-03-11 NOTE — ED Notes (Signed)
Patient endorses she was prescribed ibuprofen on the 19th and reports taking "too many" the following day.  Took 9 @ 200mg  in the span 18 hrs

## 2016-03-11 NOTE — ED Notes (Signed)
Patient Alert and oriented X4. Stable and ambulatory. Patient verbalized understanding of the discharge instructions.  Patient belongings were taken by the patient.  

## 2016-07-07 ENCOUNTER — Encounter (HOSPITAL_COMMUNITY): Payer: Self-pay | Admitting: *Deleted

## 2016-07-07 ENCOUNTER — Emergency Department (HOSPITAL_COMMUNITY)
Admission: EM | Admit: 2016-07-07 | Discharge: 2016-07-07 | Disposition: A | Discharge status: Home / Self Care | Payer: Medicaid Other | Attending: Emergency Medicine | Admitting: Emergency Medicine

## 2016-07-07 DIAGNOSIS — R110 Nausea: Secondary | ICD-10-CM | POA: Insufficient documentation

## 2016-07-07 DIAGNOSIS — R51 Headache: Secondary | ICD-10-CM | POA: Insufficient documentation

## 2016-07-07 DIAGNOSIS — R519 Headache, unspecified: Secondary | ICD-10-CM

## 2016-07-07 LAB — URINALYSIS, ROUTINE W REFLEX MICROSCOPIC
BILIRUBIN URINE: NEGATIVE
Glucose, UA: NEGATIVE mg/dL
HGB URINE DIPSTICK: NEGATIVE
Ketones, ur: NEGATIVE mg/dL
Leukocytes, UA: NEGATIVE
Nitrite: NEGATIVE
PH: 5.5 (ref 5.0–8.0)
Protein, ur: NEGATIVE mg/dL
SPECIFIC GRAVITY, URINE: 1.016 (ref 1.005–1.030)

## 2016-07-07 LAB — CBG MONITORING, ED: Glucose-Capillary: 75 mg/dL (ref 65–99)

## 2016-07-07 LAB — WET PREP, GENITAL
Clue Cells Wet Prep HPF POC: NONE SEEN
SPERM: NONE SEEN
TRICH WET PREP: NONE SEEN
YEAST WET PREP: NONE SEEN

## 2016-07-07 LAB — PREGNANCY, URINE: Preg Test, Ur: NEGATIVE

## 2016-07-07 MED ORDER — IBUPROFEN 400 MG PO TABS
600.0000 mg | ORAL_TABLET | Freq: Once | ORAL | Status: AC
  Administered 2016-07-07: 600 mg via ORAL
  Filled 2016-07-07: qty 1

## 2016-07-07 MED ORDER — ONDANSETRON 4 MG PO TBDP
8.0000 mg | ORAL_TABLET | Freq: Once | ORAL | Status: AC
  Administered 2016-07-07: 8 mg via ORAL
  Filled 2016-07-07: qty 2

## 2016-07-07 MED ORDER — ONDANSETRON 8 MG PO TBDP: 8.0000 mg | ORAL_TABLET | Freq: Three times a day (TID) | ORAL | 0 refills | Status: DC | PRN

## 2016-07-07 NOTE — ED Notes (Signed)
Checked CBG 75

## 2016-07-07 NOTE — ED Triage Notes (Signed)
The pt is c/o a headache since this am with vomiting  lmp this month

## 2016-07-08 NOTE — ED Provider Notes (Signed)
[facility]-EMERGENCY DEPT Provider Note   CSN: [id number] Arrival date & time: 07/07/16  1816     History   Chief Complaint Chief Complaint  Patient presents with  . Headache    HPI Renee Sandoval is a 20 y.o. female.  HPI Patient reports headache since this morning with nausea and vomiting.  No diarrhea.  Chills without documented fever.  She does report urinary frequency for 24 hours.  She denies abdominal pain.  She reports new scant vaginal discharge over the past 2 days.  No new sexual partners.  She has not tried any medication prior to arrival for headache.  No other complaints at this time.  Currently her headache is mild in severity.  No history of migraines.  No recent injury or trauma to her head.  Base of anticoagulants.  No weakness of her arms or legs.   Past Medical History:  Diagnosis Date  . Irregular menses     There are no active problems to display for this patient.   History reviewed. No pertinent surgical history.  OB History    No data available       Home Medications    Prior to Admission medications   Medication Sig Start Date End Date Taking? Authorizing Provider  acetaminophen (TYLENOL) 500 MG tablet Take 1,000 mg by mouth every 6 (six) hours as needed for moderate pain.    Historical Provider, MD  benzonatate (TESSALON) 100 MG capsule Take 2 capsules (200 mg total) by mouth 2 (two) times daily as needed for cough. 10/10/15   Barrett HenleNicole Elizabeth Nadeau, PA-C  cephALEXin (KEFLEX) 500 MG capsule Take 1 capsule (500 mg total) by mouth 4 (four) times daily. Patient not taking: Reported on 09/06/2015 05/04/15   Catha GosselinHanna Patel-Mills, PA-C  ibuprofen (ADVIL,MOTRIN) 800 MG tablet Take 1 tablet (800 mg total) by mouth 3 (three) times daily. 03/08/16   Elpidio AnisShari Upstill, PA-C  naproxen (NAPROSYN) 500 MG tablet Take 1 tablet (500 mg total) by mouth 2 (two) times daily. Patient not taking: Reported on 09/06/2015 05/04/15   Catha GosselinHanna Patel-Mills, PA-C  ondansetron (ZOFRAN ODT) 8  MG disintegrating tablet Take 1 tablet (8 mg total) by mouth every 8 (eight) hours as needed for nausea or vomiting. 07/07/16   Azalia BilisKevin Jilene Spohr, MD  oxymetazoline [facility](AFRIN NASAL SPRAY) 0.05 % nasal spray Place 1 spray into both nostrils 2 (two) times daily. 10/10/15   Barrett HenleNicole Elizabeth Nadeau, PA-C    Family History No family history on file.  Social History Social History  Substance Use Topics  . Smoking status: Never Smoker  . Smokeless tobacco: Never Used  . Alcohol use No     Allergies   Patient has no known allergies.   Review of Systems Review of Systems  All other systems reviewed and are negative.    Physical Exam Updated Vital Signs BP 112/96   Pulse 84   Temp 98.3 F (36.8 C) (Oral)   Resp 18   Ht 5\' 5"  (1.651 m)   Wt 280 lb (127 kg)   LMP 05/30/2016   SpO2 100%   BMI 46.59 kg/m   Physical Exam  Constitutional: She is oriented to person, place, and time. She appears well-developed and well-nourished. No distress.  HENT:  Head: Normocephalic and atraumatic.  Eyes: EOM are normal.  Neck: Normal range of motion.  Cardiovascular: Normal rate, regular rhythm and normal heart sounds.   Pulmonary/Chest: Effort normal and breath sounds normal.  Abdominal: Soft. She exhibits no distension. There is no  tenderness.  Genitourinary:  Genitourinary Comments: Chaperone present.  Normal external genitalia.  Normal-appearing cervix.  No significant vaginal discharge noted.  Musculoskeletal: Normal range of motion.  Neurological: She is alert and oriented to person, place, and time.  Skin: Skin is warm and dry.  Psychiatric: She has a normal mood and affect. Judgment normal.  Nursing note and vitals reviewed.    ED Treatments / Results  Labs (all labs ordered are listed, but only abnormal results are displayed) Labs Reviewed  WET PREP, GENITAL - Abnormal; Notable for the following:       Result Value   WBC, Wet Prep HPF POC MANY (*)    All other components within  normal limits  URINALYSIS, ROUTINE W REFLEX MICROSCOPIC (NOT [facility])  PREGNANCY, URINE  CBG MONITORING, ED  GC/CHLAMYDIA PROBE AMP ([place]) NOT [facility]    EKG  EKG Interpretation None       Radiology No results found.  Procedures Procedures (including critical care time)  Medications Ordered in ED Medications  ondansetron (ZOFRAN-ODT) disintegrating tablet 8 mg (8 mg Oral Given 07/07/16 2210)  ibuprofen (ADVIL,MOTRIN) tablet 600 mg (600 mg Oral Given 07/07/16 2210)     Initial Impression / Assessment and Plan / ED Course  I have reviewed the triage vital signs and the nursing notes.  Pertinent labs & imaging results that were available during my care of the patient were reviewed by me and considered in my medical decision making (see chart for details).  Clinical Course     Patient feels much better after ibuprofen.  No significant findings found on pelvic examination.  Wet prep without abnormality.  GC chlamydia sent but low suspicion.  Likely some sort of viral syndrome with associated chills and headache.  Overall well-appearing.  No focal weakness.  Patient understands to return to the ER for new or worsening symptoms.  Final Clinical Impressions(s) / ED Diagnoses   Final diagnoses:  Nonintractable headache, unspecified chronicity pattern, unspecified headache type  Nausea    New Prescriptions Discharge Medication List as of 07/07/2016 11:22 PM    START taking these medications   Details  ondansetron (ZOFRAN ODT) 8 MG disintegrating tablet Take 1 tablet (8 mg total) by mouth every 8 (eight) hours as needed for nausea or vomiting., Starting Fri 07/07/2016, Print         Azalia BilisKevin Lesleyanne Politte, MD 07/08/16 0009

## 2016-07-10 LAB — GC/CHLAMYDIA PROBE AMP (~~LOC~~) NOT AT ARMC
CHLAMYDIA, DNA PROBE: NEGATIVE
NEISSERIA GONORRHEA: NEGATIVE

## 2016-07-19 ENCOUNTER — Encounter (HOSPITAL_COMMUNITY): Payer: Self-pay | Admitting: *Deleted

## 2016-07-19 ENCOUNTER — Emergency Department (HOSPITAL_COMMUNITY)
Admission: EM | Admit: 2016-07-19 | Discharge: 2016-07-19 | Disposition: A | Discharge status: Home / Self Care | Payer: Medicaid Other | Attending: Emergency Medicine | Admitting: Emergency Medicine

## 2016-07-19 ENCOUNTER — Emergency Department (HOSPITAL_COMMUNITY): Payer: Medicaid Other

## 2016-07-19 DIAGNOSIS — R0789 Other chest pain: Secondary | ICD-10-CM

## 2016-07-19 MED ORDER — ACETAMINOPHEN 500 MG PO TABS
1000.0000 mg | ORAL_TABLET | Freq: Once | ORAL | Status: AC
  Administered 2016-07-19: 1000 mg via ORAL
  Filled 2016-07-19: qty 2

## 2016-07-19 MED ORDER — IBUPROFEN 400 MG PO TABS
800.0000 mg | ORAL_TABLET | Freq: Once | ORAL | Status: AC
  Administered 2016-07-19: 800 mg via ORAL
  Filled 2016-07-19: qty 2

## 2016-07-19 NOTE — ED Triage Notes (Addendum)
Pt reports having onset today of mid chest tightness and feeling weak and lightheaded. Mild sob. No acute distress is noted at triage, ekg done. Denies recent cough.

## 2016-07-19 NOTE — ED Notes (Signed)
Pt came to nurse first requesting if her name had been called. Pt informed that her name was called with no answer. Pt discharged form the lobby per MD.

## 2016-07-19 NOTE — ED Provider Notes (Signed)
[facility]-EMERGENCY DEPT Provider Note   CSN: [id number] Arrival date & time: 07/19/16  1828   By signing my name below, I, Renee Sandoval, attest that this documentation has been prepared under the direction and in the presence of Melene Plan, DO . Electronically Signed: Freida Sandoval, Scribe. 07/19/2016. 6:51 PM.  History   Chief Complaint Chief Complaint  Patient presents with  . Chest Pain  . Dizziness    The history is provided by the patient. No language interpreter was used.  Chest Pain   This is a new problem. The current episode started 3 to 5 hours ago. The problem occurs constantly. The pain is moderate. The quality of the pain is described as heavy. The pain does not radiate. Associated symptoms include dizziness. Pertinent negatives include no cough, no fever, no headaches, no nausea, no palpitations and no vomiting. She has tried rest for the symptoms. The treatment provided mild relief. Risk factors include stress.  Her past medical history is significant for hypertension.     HPI Comments:  Renee Sandoval is a 20 y.o. female who presents to the Emergency Department complaining of non-radiating central CP which began a few hours PTA. Pt describes her pain as a heaviness. Pt notes the pain began while at work. 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 Her pain is better at rest and worse when she applies pressure. She reports associated mild lightheadedness prior to onset of CP. She denies smoking history. She also denies recent leg swelling,  cough, fever, or congestion. No h/o PE/DVT, recent surgery, use of birth control pills, or long periods of immobilization. No family history of MI at a young age.   Past Medical History:  Diagnosis Date  . Irregular menses     There are no active problems to display for this patient.   History reviewed. No pertinent surgical history.  OB History    No data available       Home Medications    Prior to  Admission medications   Medication Sig Start Date End Date Taking? Authorizing Provider  acetaminophen (TYLENOL) 500 MG tablet Take 1,000 mg by mouth every 6 (six) hours as needed for moderate pain.    Historical Provider, MD  benzonatate (TESSALON) 100 MG capsule Take 2 capsules (200 mg total) by mouth 2 (two) times daily as needed for cough. 10/10/15   Barrett Henle, PA-C  cephALEXin (KEFLEX) 500 MG capsule Take 1 capsule (500 mg total) by mouth 4 (four) times daily. Patient not taking: Reported on 09/06/2015 05/04/15   Catha Gosselin, PA-C  ibuprofen (ADVIL,MOTRIN) 800 MG tablet Take 1 tablet (800 mg total) by mouth 3 (three) times daily. 03/08/16   Elpidio Anis, PA-C  naproxen (NAPROSYN) 500 MG tablet Take 1 tablet (500 mg total) by mouth 2 (two) times daily. Patient not taking: Reported on 09/06/2015 05/04/15   Catha Gosselin, PA-C  ondansetron (ZOFRAN ODT) 8 MG disintegrating tablet Take 1 tablet (8 mg total) by mouth every 8 (eight) hours as needed for nausea or vomiting. 07/07/16   Azalia Bilis, MD  oxymetazoline [facility] NASAL SPRAY) 0.05 % nasal spray Place 1 spray into both nostrils 2 (two) times daily. 10/10/15   Barrett Henle, PA-C    Family History History reviewed. No pertinent family history.  Social History Social History  Substance Use Topics  . Smoking status: Never Smoker  . Smokeless tobacco: Never Used  . Alcohol use No  Allergies   Patient has no known allergies.   Review of Systems Review of Systems  Constitutional: Negative for chills and fever.  HENT: Negative for congestion and rhinorrhea.   Eyes: Negative for redness and visual disturbance.  Respiratory: Negative for cough and wheezing.   Cardiovascular: Positive for chest pain. Negative for palpitations and leg swelling.  Gastrointestinal: Negative for nausea and vomiting.  Genitourinary: Negative for dysuria and urgency.  Musculoskeletal: Negative for arthralgias and myalgias.    Skin: Negative for pallor and wound.  Neurological: Positive for light-headedness. Negative for headaches.     Physical Exam Updated Vital Signs BP 126/73 (BP Location: Left Arm)   Pulse 76   Temp 98.5 F (36.9 C) (Oral)   Resp 18   LMP 06/23/2016   SpO2 100%   Physical Exam  Constitutional: She is oriented to person, place, and time. She appears well-developed and well-nourished. No distress.  HENT:  Head: Normocephalic and atraumatic.  Eyes: EOM are normal. Pupils are equal, round, and reactive to light.  Neck: Normal range of motion. Neck supple.  Cardiovascular: Normal rate and regular rhythm.  Exam reveals no gallop and no friction rub.   No murmur heard. Equal pulses  Pulmonary/Chest: Effort normal. She has no wheezes. She has no rales.  CP reproducible  with palpation of anterior chest wall   Abdominal: Soft. She exhibits no distension. There is no tenderness.  Musculoskeletal: She exhibits no edema or tenderness.  No BLE edema  Neurological: She is alert and oriented to person, place, and time.  Skin: Skin is warm and dry. She is not diaphoretic.  Psychiatric: She has a normal mood and affect. Her behavior is normal.  Nursing note and vitals reviewed.    ED Treatments / Results  DIAGNOSTIC STUDIES:  Oxygen Saturation is 100% on RA, normal by my interpretation.    COORDINATION OF CARE:  6:48 PM Discussed treatment plan with pt at bedside and pt agreed to plan.  Labs (all labs ordered are listed, but only abnormal results are displayed) Labs Reviewed - No data to display  EKG  EKG Interpretation  Date/Time:  Wednesday July 19 2016 18:37:26 EST Ventricular Rate:  86 PR Interval:  146 QRS Duration: 98 QT Interval:  364 QTC Calculation: 435 R Axis:   66 Text Interpretation:  Normal sinus rhythm Normal ECG No significant change since last tracing Confirmed by Tamya Denardo MD, DANIEL [phone]) on 07/19/2016 6:40:44 PM       Radiology Dg Chest 2  View  Result Date: 07/19/2016 CLINICAL DATA:  Central chest pain and mild shortness of breath. Headaches over the last month. EXAM: CHEST  2 VIEW COMPARISON:  Two-view chest x-ray a 03/08/2016 FINDINGS: The heart size and mediastinal contours are within normal limits. Both lungs are clear. The visualized skeletal structures are unremarkable. IMPRESSION: Negative two view chest x-ray Electronically Signed   By: Marin Roberts M.D.   On: 07/19/2016 19:40    Procedures Procedures (including critical care time)  Medications Ordered in ED Medications  acetaminophen (TYLENOL) tablet 1,000 mg (1,000 mg Oral Given 07/19/16 1859)  ibuprofen (ADVIL,MOTRIN) tablet 800 mg (800 mg Oral Given 07/19/16 1859)     Initial Impression / Assessment and Plan / ED Course  I have reviewed the triage vital signs and the nursing notes.  Pertinent labs & imaging results that were available during my care of the patient were reviewed by me and considered in my medical decision making (see chart for details).  Clinical  Course     20 yo F With a chief complaint of chest pain. Atypical in nature reproducible on palpation. EKG and chest x-ray are unremarkable. Patient was back in the waiting room after chest x-ray and did not return. I discussed return precautions prior to her x-ray. The patients results and plan were reviewed and discussed.   Any x-rays performed were independently reviewed by myself.   Differential diagnosis were considered with the presenting HPI.  Medications  acetaminophen (TYLENOL) tablet 1,000 mg (1,000 mg Oral Given 07/19/16 1859)  ibuprofen (ADVIL,MOTRIN) tablet 800 mg (800 mg Oral Given 07/19/16 1859)    Vitals:   07/19/16 1836  BP: 126/73  Pulse: 76  Resp: 18  Temp: 98.5 F (36.9 C)  TempSrc: Oral  SpO2: 100%    Final diagnoses:  Chest wall pain       Final Clinical Impressions(s) / ED Diagnoses   Final diagnoses:  Chest wall pain    New  Prescriptions Discharge Medication List as of 07/19/2016  8:42 PM     I personally performed the services described in this documentation, which was scribed in my presence. The recorded information has been reviewed and is accurate.      Melene Planan Maddison Kilner, DO 07/19/16 2102

## 2017-08-16 ENCOUNTER — Emergency Department (HOSPITAL_COMMUNITY)
Admission: EM | Admit: 2017-08-16 | Discharge: 2017-08-17 | Disposition: A | Discharge status: Home / Self Care | Payer: Medicaid Other | Attending: Emergency Medicine | Admitting: Emergency Medicine

## 2017-08-16 ENCOUNTER — Emergency Department (HOSPITAL_COMMUNITY): Payer: Medicaid Other

## 2017-08-16 ENCOUNTER — Encounter (HOSPITAL_COMMUNITY): Payer: Self-pay

## 2017-08-16 DIAGNOSIS — R05 Cough: Secondary | ICD-10-CM | POA: Insufficient documentation

## 2017-08-16 DIAGNOSIS — R197 Diarrhea, unspecified: Secondary | ICD-10-CM

## 2017-08-16 DIAGNOSIS — J069 Acute upper respiratory infection, unspecified: Secondary | ICD-10-CM | POA: Insufficient documentation

## 2017-08-16 DIAGNOSIS — R059 Cough, unspecified: Secondary | ICD-10-CM

## 2017-08-16 DIAGNOSIS — B349 Viral infection, unspecified: Secondary | ICD-10-CM | POA: Insufficient documentation

## 2017-08-16 LAB — CBC WITH DIFFERENTIAL/PLATELET
Basophils Absolute: 0 10*3/uL (ref 0.0–0.1)
Basophils Relative: 0 %
EOS ABS: 0.3 10*3/uL (ref 0.0–0.7)
EOS PCT: 4 %
HCT: 36.5 % (ref 36.0–46.0)
Hemoglobin: 12.3 g/dL (ref 12.0–15.0)
LYMPHS ABS: 1.4 10*3/uL (ref 0.7–4.0)
LYMPHS PCT: 23 %
MCH: 26.9 pg (ref 26.0–34.0)
MCHC: 33.7 g/dL (ref 30.0–36.0)
MCV: 79.7 fL (ref 78.0–100.0)
MONOS PCT: 5 %
Monocytes Absolute: 0.3 10*3/uL (ref 0.1–1.0)
Neutro Abs: 4 10*3/uL (ref 1.7–7.7)
Neutrophils Relative %: 68 %
PLATELETS: 455 10*3/uL — AB (ref 150–400)
RBC: 4.58 MIL/uL (ref 3.87–5.11)
RDW: 13.6 % (ref 11.5–15.5)
WBC: 5.9 10*3/uL (ref 4.0–10.5)

## 2017-08-16 LAB — BASIC METABOLIC PANEL
Anion gap: 6 (ref 5–15)
BUN: 8 mg/dL (ref 6–20)
CHLORIDE: 106 mmol/L (ref 101–111)
CO2: 25 mmol/L (ref 22–32)
CREATININE: 0.71 mg/dL (ref 0.44–1.00)
Calcium: 8.5 mg/dL — ABNORMAL LOW (ref 8.9–10.3)
GFR calc Af Amer: >60 mL/min (ref >60)
GLUCOSE: 106 mg/dL — AB (ref 65–99)
POTASSIUM: 3.6 mmol/L (ref 3.5–5.1)
Sodium: 137 mmol/L (ref 135–145)

## 2017-08-16 LAB — I-STAT BETA HCG BLOOD, ED (MC, WL, AP ONLY)

## 2017-08-16 LAB — URINALYSIS, ROUTINE W REFLEX MICROSCOPIC
Bacteria, UA: NONE SEEN
Bilirubin Urine: NEGATIVE
Glucose, UA: NEGATIVE mg/dL
Hgb urine dipstick: NEGATIVE
Ketones, ur: NEGATIVE mg/dL
Nitrite: NEGATIVE
PROTEIN: NEGATIVE mg/dL
SPECIFIC GRAVITY, URINE: 1.015 (ref 1.005–1.030)
pH: 6 (ref 5.0–8.0)

## 2017-08-16 MED ORDER — LOPERAMIDE HCL 2 MG PO CAPS: 2.0000 mg | ORAL_CAPSULE | Freq: Four times a day (QID) | ORAL | 0 refills | Status: DC | PRN

## 2017-08-16 MED ORDER — BENZONATATE 100 MG PO CAPS: 100.0000 mg | ORAL_CAPSULE | Freq: Three times a day (TID) | ORAL | 0 refills | Status: DC

## 2017-08-16 NOTE — Discharge Instructions (Signed)
Your work-up today looked good.  You mostly have a viral infection causing your symptoms.   Take the prescribed medication as directed.  These can help with your symptoms.  Would recommend tylenol/motrin in addition to these for fever/body aches. Follow-up with your primary care doctor if any ongoing issues. Return to the ED for new or worsening symptoms.

## 2017-08-16 NOTE — ED Provider Notes (Signed)
[facility]   CSN: [id number] Arrival date & time: 08/16/17  1804     History   Chief Complaint No chief complaint on file.   HPI Renee Sandoval is a 21 y.o. female.  The history is provided by the patient and medical records.     21 year old female here with multiple complaints.  States she has been feeling unwell for the past week.  Specifically she has had nasal congestion, intermittent nosebleeds, headache, sore throat, productive cough with white sputum, sensation of chest congestion, and diarrhea.  She denies fever but does report some chills and sweats intermittently.  She has not had any sick contacts.  She denies any nausea or vomiting.  Has been able to eat and drink normally.  States she started to feel achy all over in the past 24-48 hrs. she has not tried any medications for her symptoms.  She denies any current chest pain or shortness of breath.  No abdominal pain at present.  Past Medical History:  Diagnosis Date  . Irregular menses     There are no active problems to display for this patient.   History reviewed. No pertinent surgical history.  OB History    No data available       Home Medications    Prior to Admission medications   Medication Sig Start Date End Date Taking? Authorizing Provider  benzonatate (TESSALON) 100 MG capsule Take 2 capsules (200 mg total) by mouth 2 (two) times daily as needed for cough. Patient not taking: Reported on 08/16/2017 10/10/15   Barrett HenleNadeau, Nicole Elizabeth, PA-C  cephALEXin (KEFLEX) 500 MG capsule Take 1 capsule (500 mg total) by mouth 4 (four) times daily. Patient not taking: Reported on 09/06/2015 05/04/15   Patel-Mills, Lorelle FormosaHanna, PA-C  ibuprofen (ADVIL,MOTRIN) 800 MG tablet Take 1 tablet (800 mg total) by mouth 3 (three) times daily. Patient not taking: Reported on 08/16/2017 03/08/16   Elpidio AnisUpstill, Shari, PA-C  naproxen (NAPROSYN) 500 MG tablet Take 1 tablet (500 mg total)  by mouth 2 (two) times daily. Patient not taking: Reported on 09/06/2015 05/04/15   Patel-Mills, Lorelle FormosaHanna, PA-C  ondansetron (ZOFRAN ODT) 8 MG disintegrating tablet Take 1 tablet (8 mg total) by mouth every 8 (eight) hours as needed for nausea or vomiting. Patient not taking: Reported on 08/16/2017 07/07/16   Azalia Bilisampos, Kevin, MD  oxymetazoline [facility](AFRIN NASAL SPRAY) 0.05 % nasal spray Place 1 spray into both nostrils 2 (two) times daily. Patient not taking: Reported on 08/16/2017 10/10/15   Barrett HenleNadeau, Nicole Elizabeth, PA-C    Family History History reviewed. No pertinent family history.  Social History Social History   Tobacco Use  . Smoking status: Never Smoker  . Smokeless tobacco: Never Used  Substance Use Topics  . Alcohol use: No  . Drug use: No     Allergies   Patient has no known allergies.   Review of Systems Review of Systems  Constitutional: Positive for chills.  HENT: Positive for congestion and sore throat.   Respiratory: Positive for cough.   Gastrointestinal: Positive for diarrhea.  Musculoskeletal: Positive for myalgias.  Neurological: Positive for headaches.  All other systems reviewed and are negative.    Physical Exam Updated Vital Signs BP 135/77 (BP Location: Right Arm)   Pulse 75   Temp 98.5 F (36.9 C) (Oral)   Resp 15   Ht 5\' 5"  (1.651 m)   Wt 131.5 kg (290 lb)   LMP 07/16/2017 (Exact Date)   SpO2  100%   BMI 48.26 kg/m   Physical Exam  Constitutional: She is oriented to person, place, and time. She appears well-developed and well-nourished. No distress.  Overall appears well, NAD  HENT:  Head: Normocephalic and atraumatic.  Right Ear: Tympanic membrane and external ear normal.  Left Ear: Tympanic membrane and external ear normal.  Nose: Mucosal edema present.  Mouth/Throat: Uvula is midline, oropharynx is clear and moist and mucous membranes are normal.  + nasal congestion with PND; no tonsillar edema or exudates  Eyes: Conjunctivae and EOM are  normal. Pupils are equal, round, and reactive to light.  Neck: Normal range of motion and full passive range of motion without pain. Neck supple. No neck rigidity.  No rigidity, no meningismus  Cardiovascular: Normal rate, regular rhythm and normal heart sounds.  No murmur heard. Pulmonary/Chest: Effort normal and breath sounds normal. No stridor. No respiratory distress. She has no wheezes. She has no rhonchi.  Abdominal: Soft. Bowel sounds are normal. There is no tenderness. There is no rebound and no guarding.  Musculoskeletal: Normal range of motion. She exhibits no edema.  Neurological: She is alert and oriented to person, place, and time. She has normal strength. She displays no tremor. No cranial nerve deficit or sensory deficit. She displays no seizure activity.  AAOx3, answering questions and following commands appropriately; equal strength UE and LE bilaterally; CN grossly intact; moves all extremities appropriately without ataxia; no focal neuro deficits or facial asymmetry appreciated  Skin: Skin is warm and dry. No rash noted. She is not diaphoretic.  Psychiatric: She has a normal mood and affect. Her behavior is normal. Thought content normal.  Nursing note and vitals reviewed.    ED Treatments / Results  Labs (all labs ordered are listed, but only abnormal results are displayed) Labs Reviewed  CBC WITH DIFFERENTIAL/PLATELET - Abnormal; Notable for the following components:      Result Value   Platelets 455 (*)    All other components within normal limits  BASIC METABOLIC PANEL - Abnormal; Notable for the following components:   Glucose, Bld 106 (*)    Calcium 8.5 (*)    All other components within normal limits  URINALYSIS, ROUTINE W REFLEX MICROSCOPIC - Abnormal; Notable for the following components:   Leukocytes, UA TRACE (*)    Squamous Epithelial / LPF 0-5 (*)    All other components within normal limits  I-STAT BETA HCG BLOOD, ED ([facility], [facility], [facility] ONLY)    EKG  EKG  Interpretation None       Radiology No results found.  Procedures Procedures (including critical care time)  Medications Ordered in ED Medications - No data to display   Initial Impression / Assessment and Plan / ED Course  I have reviewed the triage vital signs and the nursing notes.  Pertinent labs & imaging results that were available during my care of the patient were reviewed by me and considered in my medical decision making (see chart for details).  21 year old female here with very likely viral illness.  Has had multiple symptoms including respiratory and GI.  She is afebrile and nontoxic in appearance here.  Exam overall benign aside from some nasal congestion and postnasal drip.  Lungs are overall clear without wheezes or rhonchi.  Abdomen is soft and benign.  Headache without associated neurologic deficit or signs/symptoms of meningitis.  Screening lab work obtained from triage is overall reassuring.  Chest x-ray was obtained after my initial assessment and is negative.  Discussed  with patient expected management and symptomatic control for likely viral illness.  She was given prescriptions as well as work note.  Encouraged rest and oral fluids.  Close follow-up with PCP if any ongoing issues.  Discussed plan with patient, she acknowledged understanding and agreed with plan of care.  Return precautions given for new or worsening symptoms.  Final Clinical Impressions(s) / ED Diagnoses   Final diagnoses:  Viral URI  Cough  Diarrhea, unspecified type    ED Discharge Orders        Ordered    loperamide (IMODIUM) 2 MG capsule  4 times daily PRN     08/16/17 2323    benzonatate (TESSALON) 100 MG capsule  Every 8 hours     08/16/17 2323       Garlon Hatchet, PA-C 08/16/17 2329    Shaune Pollack, MD 08/17/17 [phone]

## 2017-08-16 NOTE — ED Triage Notes (Addendum)
Pt presents with 3 day h/o productive cough with yellow/green phlegm and clear nasal drainage.  Pt reports frontal headache and epitaxis to both nares, pt is able to stop bleeding. +generalized muscle cramping. Pt also report bilateral lower abdominal pain, +nausea and diarrhea.

## 2017-08-17 NOTE — ED Notes (Signed)
Pt departed in NAD, refused use of wheelchair.  

## 2018-01-07 ENCOUNTER — Emergency Department (HOSPITAL_COMMUNITY)
Admission: EM | Admit: 2018-01-07 | Discharge: 2018-01-07 | Disposition: A | Discharge status: Home / Self Care | Payer: Self-pay | Attending: Emergency Medicine | Admitting: Emergency Medicine

## 2018-01-07 ENCOUNTER — Emergency Department (HOSPITAL_COMMUNITY): Payer: Self-pay

## 2018-01-07 DIAGNOSIS — R1011 Right upper quadrant pain: Secondary | ICD-10-CM | POA: Insufficient documentation

## 2018-01-07 LAB — COMPREHENSIVE METABOLIC PANEL
ALBUMIN: 3.9 g/dL (ref 3.5–5.0)
ALK PHOS: 58 U/L (ref 38–126)
ALT: 12 U/L — AB (ref 14–54)
AST: 17 U/L (ref 15–41)
Anion gap: 9 (ref 5–15)
BILIRUBIN TOTAL: 1.2 mg/dL (ref 0.3–1.2)
BUN: 11 mg/dL (ref 6–20)
CO2: 24 mmol/L (ref 22–32)
CREATININE: 0.85 mg/dL (ref 0.44–1.00)
Calcium: 9.2 mg/dL (ref 8.9–10.3)
Chloride: 108 mmol/L (ref 101–111)
GFR calc Af Amer: >60 mL/min (ref >60)
GLUCOSE: 83 mg/dL (ref 65–99)
Potassium: 4.5 mmol/L (ref 3.5–5.1)
Sodium: 141 mmol/L (ref 135–145)
TOTAL PROTEIN: 7.1 g/dL (ref 6.5–8.1)

## 2018-01-07 LAB — CBC WITH DIFFERENTIAL/PLATELET
BASOS ABS: 0 10*3/uL (ref 0.0–0.1)
BASOS PCT: 0 %
EOS PCT: 2 %
Eosinophils Absolute: 0.1 10*3/uL (ref 0.0–0.7)
HEMATOCRIT: 37.3 % (ref 36.0–46.0)
Hemoglobin: 12.8 g/dL (ref 12.0–15.0)
LYMPHS PCT: 45 %
Lymphs Abs: 2.3 10*3/uL (ref 0.7–4.0)
MCH: 27.3 pg (ref 26.0–34.0)
MCHC: 34.3 g/dL (ref 30.0–36.0)
MCV: 79.5 fL (ref 78.0–100.0)
MONO ABS: 0.3 10*3/uL (ref 0.1–1.0)
MONOS PCT: 7 %
NEUTROS ABS: 2.4 10*3/uL (ref 1.7–7.7)
Neutrophils Relative %: 46 %
PLATELETS: 424 10*3/uL — AB (ref 150–400)
RBC: 4.69 MIL/uL (ref 3.87–5.11)
RDW: 13.2 % (ref 11.5–15.5)
WBC: 5.2 10*3/uL (ref 4.0–10.5)

## 2018-01-07 LAB — URINALYSIS, ROUTINE W REFLEX MICROSCOPIC
BILIRUBIN URINE: NEGATIVE
Glucose, UA: NEGATIVE mg/dL
Hgb urine dipstick: NEGATIVE
KETONES UR: NEGATIVE mg/dL
LEUKOCYTES UA: NEGATIVE
Nitrite: NEGATIVE
PROTEIN: NEGATIVE mg/dL
Specific Gravity, Urine: 1.014 (ref 1.005–1.030)
pH: 5 (ref 5.0–8.0)

## 2018-01-07 LAB — I-STAT BETA HCG BLOOD, ED (MC, WL, AP ONLY): I-stat hCG, quantitative: <5 m[IU]/mL (ref <5)

## 2018-01-07 LAB — LIPASE, BLOOD: Lipase: 23 U/L (ref 11–51)

## 2018-01-07 MED ORDER — MORPHINE SULFATE (PF) 4 MG/ML IV SOLN
4.0000 mg | Freq: Once | INTRAVENOUS | Status: AC
  Administered 2018-01-07: 4 mg via INTRAVENOUS
  Filled 2018-01-07: qty 1

## 2018-01-07 MED ORDER — ONDANSETRON HCL 4 MG/2ML IJ SOLN
4.0000 mg | Freq: Once | INTRAMUSCULAR | Status: AC
  Administered 2018-01-07: 4 mg via INTRAVENOUS
  Filled 2018-01-07: qty 2

## 2018-01-07 MED ORDER — SODIUM CHLORIDE 0.9 % IV BOLUS
1000.0000 mL | Freq: Once | INTRAVENOUS | Status: AC
  Administered 2018-01-07: 1000 mL via INTRAVENOUS

## 2018-01-07 MED ORDER — SODIUM CHLORIDE 0.9 % IV SOLN
INTRAVENOUS | Status: DC
  Administered 2018-01-07: 08:00:00 via INTRAVENOUS

## 2018-01-07 MED ORDER — FAMOTIDINE-CA CARB-MAG HYDROX 10-800-165 MG PO CHEW: 1.0000 | CHEWABLE_TABLET | Freq: Every day | ORAL | 1 refills | Status: DC | PRN

## 2018-01-07 NOTE — ED Provider Notes (Signed)
[facility]PT Provider Note   CSN: [id number] Arrival date & time: 01/07/18  4098     History   Chief Complaint Chief Complaint  Patient presents with  . Chest Pain  . Abdominal Pain    HPI Renee Sandoval is a 22 y.o. female.  The history is provided by the patient.  Abdominal Pain    Patient presents to the emergency room for evaluation of abdominal pain.  Patient states her symptoms started yesterday.  Initially she noticed some discomfort in the center of her chest.  She did not think too much of it initially.  She started having some trouble with nausea yesterday but has not vomited.  The pain is now migrated and located primarily in her right upper quadrant.  She denies any vomiting.  No dysuria although she does have some sense of urgency.  She denies any constipation.  She had a few loose stools yesterday but has not had frequent diarrhea.  She denies any shortness of breath.  No cough.  No fevers.  No history of similar episodes in the past.  She denies any prior abdominal surgeries.  Past Medical History:  Diagnosis Date  . Irregular menses     There are no active problems to display for this patient.   No past surgical history on file.   OB History   None      Home Medications    Prior to Admission medications   Medication Sig Start Date End Date Taking? Authorizing Provider  benzonatate (TESSALON) 100 MG capsule Take 1 capsule (100 mg total) by mouth every 8 (eight) hours. 08/16/17   Garlon Hatchet, PA-C  cephALEXin (KEFLEX) 500 MG capsule Take 1 capsule (500 mg total) by mouth 4 (four) times daily. Patient not taking: Reported on 09/06/2015 05/04/15   Catha Gosselin, PA-C  famotidine-calcium carbonate-magnesium hydroxide (PEPCID COMPLETE) 10-800-165 MG chewable tablet Chew 1 tablet by mouth daily as needed. 01/07/18   Linwood Dibbles, MD  ibuprofen (ADVIL,MOTRIN) 800 MG tablet Take 1 tablet (800 mg total) by mouth 3 (three)  times daily. Patient not taking: Reported on 08/16/2017 03/08/16   Elpidio Anis, PA-C  naproxen (NAPROSYN) 500 MG tablet Take 1 tablet (500 mg total) by mouth 2 (two) times daily. Patient not taking: Reported on 09/06/2015 05/04/15   Patel-Mills, Lorelle Formosa, PA-C  ondansetron (ZOFRAN ODT) 8 MG disintegrating tablet Take 1 tablet (8 mg total) by mouth every 8 (eight) hours as needed for nausea or vomiting. Patient not taking: Reported on 08/16/2017 07/07/16   Azalia Bilis, MD  oxymetazoline [facility] NASAL SPRAY) 0.05 % nasal spray Place 1 spray into both nostrils 2 (two) times daily. Patient not taking: Reported on 08/16/2017 10/10/15   Barrett Henle, PA-C    Family History No family history on file.  Social History Social History   Tobacco Use  . Smoking status: Never Smoker  . Smokeless tobacco: Never Used  Substance Use Topics  . Alcohol use: No  . Drug use: No     Allergies   Patient has no known allergies.   Review of Systems Review of Systems  All other systems reviewed and are negative.    Physical Exam Updated Vital Signs BP 104/70   Pulse 70   Temp 97.8 F (36.6 C) (Oral)   Resp 16   SpO2 96%   Physical Exam  Constitutional: She appears well-developed and well-nourished. No distress.  HENT:  Head: Normocephalic and atraumatic.  Right Ear: External ear  normal.  Left Ear: External ear normal.  Eyes: Conjunctivae are normal. Right eye exhibits no discharge. Left eye exhibits no discharge. No scleral icterus.  Neck: Neck supple. No tracheal deviation present.  Cardiovascular: Normal rate, regular rhythm and intact distal pulses.  Pulmonary/Chest: Effort normal and breath sounds normal. No stridor. No respiratory distress. She has no wheezes. She has no rales.  Abdominal: Soft. Bowel sounds are normal. She exhibits no distension. There is tenderness in the right upper quadrant. There is no rigidity, no rebound and no guarding. No hernia.  Musculoskeletal:  She exhibits no edema or tenderness.  Neurological: She is alert. She has normal strength. No cranial nerve deficit (no facial droop, extraocular movements intact, no slurred speech) or sensory deficit. She exhibits normal muscle tone. She displays no seizure activity. Coordination normal.  Skin: Skin is warm and dry. No rash noted.  Psychiatric: She has a normal mood and affect.  Nursing note and vitals reviewed.    ED Treatments / Results  Labs (all labs ordered are listed, but only abnormal results are displayed) Labs Reviewed  COMPREHENSIVE METABOLIC PANEL - Abnormal; Notable for the following components:      Result Value   ALT 12 (*)    All other components within normal limits  CBC WITH DIFFERENTIAL/PLATELET - Abnormal; Notable for the following components:   Platelets 424 (*)    All other components within normal limits  LIPASE, BLOOD  URINALYSIS, ROUTINE W REFLEX MICROSCOPIC  I-STAT BETA HCG BLOOD, ED ([facility], [facility], [facility] ONLY)    EKG None  Radiology US Abdomen Limited Ruq  Result Date: 01/07/2018 CLINICAL DATA:  One-day history of right upper quadrant pain. EXAM: ULTRASOUND ABDOMEN LIMITED RIGHT UPPER QUADRANT COMPARISON:  None. FINDINGS: Gallbladder: No gallstones or wall thickening visualized. No sonographic Murphy sign noted by sonographer. Common bile duct: Diameter: 2-3 mm Liver: No focal lesion identified. Within normal limits in parenchymal echogenicity. Portal vein is patent on color Doppler imaging with normal direction of blood flow towards the liver. IMPRESSION: Normal right upper quadrant ultrasound. Electronically Signed   By: Kennith Center M.D.   On: 01/07/2018 11:46    Procedures Procedures (including critical care time)  Medications Ordered in ED Medications  sodium chloride 0.9 % bolus 1,000 mL (0 mLs Intravenous Stopped 01/07/18 1118)    And  0.9 %  sodium chloride infusion ( Intravenous New Bag/Given 01/07/18 0800)  ondansetron (ZOFRAN) injection 4 mg (4 mg  Intravenous Given 01/07/18 0759)  morphine 4 MG/ML injection 4 mg (4 mg Intravenous Given 01/07/18 0759)     Initial Impression / Assessment and Plan / ED Course  I have reviewed the triage vital signs and the nursing notes.  Pertinent labs & imaging results that were available during my care of the patient were reviewed by me and considered in my medical decision making (see chart for details).   Patient presented to the emergency room for evaluation of upper abdominal pain.  Patient's laboratory tests are reassuring.  She had persistent pain in her right upper quadrant so an ultrasound was done to look for cholelithiasis or cholecystitis.  Ultrasound is reassuring.  Patient improved with treatment the emergency room.  No further episodes of vomiting.  I doubt appendicitis, cholecystitis, diverticulitis, obstruction or other emergency etiology.  Final Clinical Impressions(s) / ED Diagnoses   Final diagnoses:  RUQ pain    ED Discharge Orders        Ordered    famotidine-calcium carbonate-magnesium hydroxide (PEPCID COMPLETE)  10-800-165 MG chewable tablet  Daily PRN     01/07/18 1231       Linwood Dibbles, MD 01/07/18 1233

## 2018-01-07 NOTE — ED Triage Notes (Signed)
Pt c/o abdominal pain and central CP since last night. Endorses nausea that started yesterday, no vomiting. Stated that when the pain hits her she becomes dizzy. No LOC or syncope. No med hx.

## 2018-01-07 NOTE — Discharge Instructions (Addendum)
Take the medications as needed, follow-up with a primary care doctor to be rechecked if not improving in the next week

## 2018-04-08 ENCOUNTER — Emergency Department (HOSPITAL_COMMUNITY)
Admission: EM | Admit: 2018-04-08 | Discharge: 2018-04-08 | Disposition: A | Discharge status: Home / Self Care | Payer: Medicaid Other | Attending: Emergency Medicine | Admitting: Emergency Medicine

## 2018-04-08 ENCOUNTER — Encounter (HOSPITAL_COMMUNITY): Payer: Self-pay | Admitting: Emergency Medicine

## 2018-04-08 DIAGNOSIS — Z5321 Procedure and treatment not carried out due to patient leaving prior to being seen by health care provider: Secondary | ICD-10-CM | POA: Insufficient documentation

## 2018-04-08 DIAGNOSIS — R1084 Generalized abdominal pain: Secondary | ICD-10-CM | POA: Insufficient documentation

## 2018-04-08 LAB — COMPREHENSIVE METABOLIC PANEL
ALK PHOS: 56 U/L (ref 38–126)
ALT: 15 U/L (ref 0–44)
AST: 16 U/L (ref 15–41)
Albumin: 3.7 g/dL (ref 3.5–5.0)
Anion gap: 9 (ref 5–15)
BILIRUBIN TOTAL: 0.8 mg/dL (ref 0.3–1.2)
BUN: 9 mg/dL (ref 6–20)
CALCIUM: 9 mg/dL (ref 8.9–10.3)
CHLORIDE: 108 mmol/L (ref 98–111)
CO2: 25 mmol/L (ref 22–32)
CREATININE: 0.78 mg/dL (ref 0.44–1.00)
Glucose, Bld: 113 mg/dL — ABNORMAL HIGH (ref 70–99)
Potassium: 3.6 mmol/L (ref 3.5–5.1)
Sodium: 142 mmol/L (ref 135–145)
Total Protein: 7 g/dL (ref 6.5–8.1)

## 2018-04-08 LAB — CBC
HCT: 37.1 % (ref 36.0–46.0)
Hemoglobin: 12.5 g/dL (ref 12.0–15.0)
MCH: 26.7 pg (ref 26.0–34.0)
MCHC: 33.7 g/dL (ref 30.0–36.0)
MCV: 79.3 fL (ref 78.0–100.0)
PLATELETS: 561 10*3/uL — AB (ref 150–400)
RBC: 4.68 MIL/uL (ref 3.87–5.11)
RDW: 13.4 % (ref 11.5–15.5)
WBC: 5.2 10*3/uL (ref 4.0–10.5)

## 2018-04-08 LAB — I-STAT BETA HCG BLOOD, ED (MC, WL, AP ONLY): I-stat hCG, quantitative: <5 m[IU]/mL (ref <5)

## 2018-04-08 LAB — LIPASE, BLOOD: LIPASE: 26 U/L (ref 11–51)

## 2018-04-08 NOTE — ED Triage Notes (Signed)
Pt c/o abd cramping and having vaginal bleeding with clots since 8/15. Pt reports intermittent vaginal spotting for past couple months.

## 2018-04-08 NOTE — ED Triage Notes (Signed)
Pt left per registration 

## 2018-04-09 NOTE — ED Notes (Signed)
Follow up call made  No answer  04/09/18 1219  s Jayr Lupercio rn

## 2018-05-17 ENCOUNTER — Other Ambulatory Visit: Payer: Self-pay

## 2018-05-17 ENCOUNTER — Emergency Department (HOSPITAL_COMMUNITY)
Admission: EM | Admit: 2018-05-17 | Discharge: 2018-05-18 | Disposition: A | Discharge status: Home / Self Care | Payer: Medicaid Other | Attending: Emergency Medicine | Admitting: Emergency Medicine

## 2018-05-17 ENCOUNTER — Encounter (HOSPITAL_COMMUNITY): Payer: Self-pay | Admitting: Emergency Medicine

## 2018-05-17 DIAGNOSIS — Z79899 Other long term (current) drug therapy: Secondary | ICD-10-CM | POA: Insufficient documentation

## 2018-05-17 DIAGNOSIS — R102 Pelvic and perineal pain: Secondary | ICD-10-CM

## 2018-05-17 LAB — COMPREHENSIVE METABOLIC PANEL
ALBUMIN: 3.5 g/dL (ref 3.5–5.0)
ALT: 15 U/L (ref 0–44)
AST: 14 U/L — AB (ref 15–41)
Alkaline Phosphatase: 51 U/L (ref 38–126)
Anion gap: 10 (ref 5–15)
BILIRUBIN TOTAL: 0.7 mg/dL (ref 0.3–1.2)
BUN: 5 mg/dL — AB (ref 6–20)
CALCIUM: 9.2 mg/dL (ref 8.9–10.3)
CO2: 23 mmol/L (ref 22–32)
CREATININE: 0.83 mg/dL (ref 0.44–1.00)
Chloride: 107 mmol/L (ref 98–111)
GFR calc Af Amer: >60 mL/min (ref >60)
GFR calc non Af Amer: >60 mL/min (ref >60)
GLUCOSE: 87 mg/dL (ref 70–99)
POTASSIUM: 3.9 mmol/L (ref 3.5–5.1)
SODIUM: 140 mmol/L (ref 135–145)
TOTAL PROTEIN: 6.7 g/dL (ref 6.5–8.1)

## 2018-05-17 LAB — WET PREP, GENITAL
Sperm: NONE SEEN
TRICH WET PREP: NONE SEEN
YEAST WET PREP: NONE SEEN

## 2018-05-17 LAB — I-STAT BETA HCG BLOOD, ED (MC, WL, AP ONLY): I-stat hCG, quantitative: <5 m[IU]/mL (ref <5)

## 2018-05-17 LAB — URINALYSIS, ROUTINE W REFLEX MICROSCOPIC
BILIRUBIN URINE: NEGATIVE
Glucose, UA: NEGATIVE mg/dL
Hgb urine dipstick: NEGATIVE
KETONES UR: NEGATIVE mg/dL
LEUKOCYTES UA: NEGATIVE
NITRITE: NEGATIVE
PH: 5 (ref 5.0–8.0)
PROTEIN: NEGATIVE mg/dL
Specific Gravity, Urine: 1.017 (ref 1.005–1.030)

## 2018-05-17 LAB — CBC
HCT: 39.7 % (ref 36.0–46.0)
Hemoglobin: 12.9 g/dL (ref 12.0–15.0)
MCH: 26.7 pg (ref 26.0–34.0)
MCHC: 32.5 g/dL (ref 30.0–36.0)
MCV: 82.2 fL (ref 78.0–100.0)
PLATELETS: 570 10*3/uL — AB (ref 150–400)
RBC: 4.83 MIL/uL (ref 3.87–5.11)
RDW: 13.3 % (ref 11.5–15.5)
WBC: 6.5 10*3/uL (ref 4.0–10.5)

## 2018-05-17 LAB — LIPASE, BLOOD: LIPASE: 22 U/L (ref 11–51)

## 2018-05-17 MED ORDER — CEFTRIAXONE SODIUM 250 MG IJ SOLR
250.0000 mg | Freq: Once | INTRAMUSCULAR | Status: AC
  Administered 2018-05-18: 250 mg via INTRAMUSCULAR
  Filled 2018-05-17: qty 250

## 2018-05-17 NOTE — ED Provider Notes (Signed)
[facility] EMERGENCY DEPARTMENT Provider Note   CSN: [id number] Arrival date & time: 05/17/18  1711     History   Chief Complaint Chief Complaint  Patient presents with  . Pelvic Pain  . Nausea    HPI Renee Sandoval is a 22 y.o. female.  22 year old female presents to the emergency department for evaluation of pelvic pain.  Pelvic pain has been intermittent and sharp.  She feels as though it radiates from her vagina to her lower stomach.  This has been associated with intermittent nausea as well.  She denies any modifying factors of her symptoms.  She has had increased white, foul-smelling discharge which is abnormal for her.  She also notes vaginal spotting with last menstrual period on 04/04/2018.  Denies fever, vomiting, dysuria, hematuria.  No history of abdominal surgeries.  She has been sexually active with one female partner in the past 6 months.  Denies the use of barrier protection during intercourse.     Past Medical History:  Diagnosis Date  . Irregular menses     There are no active problems to display for this patient.   History reviewed. No pertinent surgical history.   OB History   None      Home Medications    Prior to Admission medications   Medication Sig Start Date End Date Taking? Authorizing Provider  benzonatate (TESSALON) 100 MG capsule Take 1 capsule (100 mg total) by mouth every 8 (eight) hours. 08/16/17   Garlon Hatchet, PA-C  cephALEXin (KEFLEX) 500 MG capsule Take 1 capsule (500 mg total) by mouth 4 (four) times daily. Patient not taking: Reported on 09/06/2015 05/04/15   Patel-Mills, Lorelle Formosa, PA-C  doxycycline (VIBRAMYCIN) 100 MG capsule Take 1 capsule (100 mg total) by mouth 2 (two) times daily. Take as prescribed until finished. 05/18/18   Antony Madura, PA-C  famotidine-calcium carbonate-magnesium hydroxide (PEPCID COMPLETE) 10-800-165 MG chewable tablet Chew 1 tablet by mouth daily as needed. 01/07/18   Linwood Dibbles, MD    ibuprofen (ADVIL,MOTRIN) 800 MG tablet Take 1 tablet (800 mg total) by mouth 3 (three) times daily. Patient not taking: Reported on 08/16/2017 03/08/16   Elpidio Anis, PA-C  naproxen (NAPROSYN) 500 MG tablet Take 1 tablet (500 mg total) by mouth every 12 (twelve) hours as needed for mild pain or moderate pain. 05/18/18   Antony Madura, PA-C  ondansetron (ZOFRAN ODT) 8 MG disintegrating tablet Take 1 tablet (8 mg total) by mouth every 8 (eight) hours as needed for nausea or vomiting. Patient not taking: Reported on 08/16/2017 07/07/16   Azalia Bilis, MD  oxymetazoline [facility] NASAL SPRAY) 0.05 % nasal spray Place 1 spray into both nostrils 2 (two) times daily. Patient not taking: Reported on 08/16/2017 10/10/15   Barrett Henle, PA-C    Family History No family history on file.  Social History Social History   Tobacco Use  . Smoking status: Never Smoker  . Smokeless tobacco: Never Used  Substance Use Topics  . Alcohol use: No  . Drug use: No     Allergies   Patient has no known allergies.   Review of Systems Review of Systems Ten systems reviewed and are negative for acute change, except as noted in the HPI.    Physical Exam Updated Vital Signs BP 130/80   Pulse 80   Temp 98.2 F (36.8 C) (Oral)   Resp 16   Ht 5\' 5"  (1.651 m)   Wt 123.4 kg   LMP 04/04/2018  SpO2 100%   BMI 45.26 kg/m   Physical Exam  Constitutional: She is oriented to person, place, and time. She appears well-developed and well-nourished. No distress.  Nontoxic appearing and in NAD. Obese.  HENT:  Head: Normocephalic and atraumatic.  Eyes: Conjunctivae and EOM are normal. No scleral icterus.  Neck: Normal range of motion.  Cardiovascular: Normal rate, regular rhythm and intact distal pulses.  Pulmonary/Chest: Effort normal. No stridor. No respiratory distress.  Respirations even and unlabored  Abdominal: Soft.  Soft, obese abdomen. No peritoneal signs.  Genitourinary: There is no  rash, tenderness or lesion on the right labia. There is no rash, tenderness or lesion on the left labia. Uterus is tender (mild). Cervix exhibits friability (mild). Cervix exhibits no motion tenderness. Right adnexum displays no mass and no tenderness. Left adnexum displays no mass and no tenderness.  Musculoskeletal: Normal range of motion.  Neurological: She is alert and oriented to person, place, and time. She exhibits normal muscle tone. Coordination normal.  GCS 15. Patient moving all extremities.  Skin: Skin is warm and dry. No rash noted. She is not diaphoretic. No erythema. No pallor.  Psychiatric: She has a normal mood and affect. Her behavior is normal.  Nursing note and vitals reviewed.    ED Treatments / Results  Labs (all labs ordered are listed, but only abnormal results are displayed) Labs Reviewed  WET PREP, GENITAL - Abnormal; Notable for the following components:      Result Value   Clue Cells Wet Prep HPF POC PRESENT (*)    WBC, Wet Prep HPF POC FEW (*)    All other components within normal limits  COMPREHENSIVE METABOLIC PANEL - Abnormal; Notable for the following components:   BUN 5 (*)    AST 14 (*)    All other components within normal limits  CBC - Abnormal; Notable for the following components:   Platelets 570 (*)    All other components within normal limits  LIPASE, BLOOD  URINALYSIS, ROUTINE W REFLEX MICROSCOPIC  I-STAT BETA HCG BLOOD, ED ([facility], [facility], [facility] ONLY)  GC/CHLAMYDIA PROBE AMP ([place]) NOT AT [facility]    EKG None  Radiology No results found.  Procedures Procedures (including critical care time)  Medications Ordered in ED Medications  cefTRIAXone (ROCEPHIN) injection 250 mg (250 mg Intramuscular Given 05/18/18 0008)     Initial Impression / Assessment and Plan / ED Course  I have reviewed the triage vital signs and the nursing notes.  Pertinent labs & imaging results that were available during my care of the patient were reviewed by me  and considered in my medical decision making (see chart for details).     22 year old female presents to the emergency department for evaluation of ongoing pelvic pain with associated vaginal spotting and discharge.  She has mild uterine tenderness on exam.  Otherwise is nontoxic-appearing.  No fevers, vomiting, urinary symptoms.  Laboratory work-up today reassuring without leukocytosis or electrolyte derangements.  Liver and kidney function preserved.  Pregnancy is negative.  There is no evidence of UTI.  Patient does express concern for possible STDs, though white blood cell count on wet prep is low.  Gonorrhea and Chlamydia cultures are pending.  Given persistent pelvic pain with history of unprotected sexual intercourse, will treat for PID with Rocephin and doxycycline.  Patient referred to [facility] outpatient clinic for follow-up.  Naproxen prescribed for management of pain control.  Return precautions discussed and provided. Patient discharged in stable condition with no unaddressed  concerns.   Final Clinical Impressions(s) / ED Diagnoses   Final diagnoses:  Pelvic pain in female    ED Discharge Orders         Ordered    naproxen (NAPROSYN) 500 MG tablet  Every 12 hours PRN     05/18/18 0000    doxycycline (VIBRAMYCIN) 100 MG capsule  2 times daily     05/18/18 0000           Antony Madura, PA-C 05/18/18 9147    Benjiman Core, MD 05/20/18 [phone]

## 2018-05-17 NOTE — ED Triage Notes (Signed)
Pt reports pelvic pain and nausea for the last month that became worse today. Pt reports spotting and white discharge with an odor over the last month. Pt also endorses diarrhea, denies vomiting. LMP 04/04/18.

## 2018-05-18 MED ORDER — DOXYCYCLINE HYCLATE 100 MG PO CAPS: 100.0000 mg | ORAL_CAPSULE | Freq: Two times a day (BID) | ORAL | 0 refills | Status: DC

## 2018-05-18 MED ORDER — NAPROXEN 500 MG PO TABS: 500.0000 mg | ORAL_TABLET | Freq: Two times a day (BID) | ORAL | 0 refills | Status: DC | PRN

## 2018-05-18 NOTE — Discharge Instructions (Signed)
You are being treated for pelvic inflammatory disease which will cover STDs such as gonorrhea and chlamydia. Follow-up with the health department in 48 hours for the results of your STD tests. If you test positive for STDs, notify all sexual partners of their need to be tested and treated as well. Do not engage in sexual intercourse for one week. Use a condom when sexually active.  You may take naproxen as prescribed for management of your abdominal discomfort.  Follow-up with an OB/GYN if symptoms persist.

## 2018-05-18 NOTE — ED Notes (Signed)
Pt verbalizes understanding of d/c instructions. Prescriptions reviewed with patient. Pt ambulatory at d/c with all belongings.  

## 2018-05-20 LAB — GC/CHLAMYDIA PROBE AMP (~~LOC~~) NOT AT ARMC
Chlamydia: NEGATIVE
NEISSERIA GONORRHEA: NEGATIVE

## 2018-05-23 NOTE — ED Notes (Signed)
05/23/2018,  Returned pt.s call, no answer, unable to leave a message

## 2018-09-17 ENCOUNTER — Encounter (HOSPITAL_COMMUNITY): Payer: Self-pay

## 2018-09-17 ENCOUNTER — Emergency Department (HOSPITAL_COMMUNITY)
Admission: EM | Admit: 2018-09-17 | Discharge: 2018-09-18 | Disposition: A | Discharge status: Home / Self Care | Payer: Medicaid Other | Attending: Emergency Medicine | Admitting: Emergency Medicine

## 2018-09-17 DIAGNOSIS — N739 Female pelvic inflammatory disease, unspecified: Secondary | ICD-10-CM | POA: Insufficient documentation

## 2018-09-17 DIAGNOSIS — N760 Acute vaginitis: Secondary | ICD-10-CM | POA: Insufficient documentation

## 2018-09-17 DIAGNOSIS — G8929 Other chronic pain: Secondary | ICD-10-CM

## 2018-09-17 DIAGNOSIS — B9689 Other specified bacterial agents as the cause of diseases classified elsewhere: Secondary | ICD-10-CM

## 2018-09-17 DIAGNOSIS — R102 Pelvic and perineal pain: Secondary | ICD-10-CM | POA: Insufficient documentation

## 2018-09-17 LAB — URINALYSIS, ROUTINE W REFLEX MICROSCOPIC
BILIRUBIN URINE: NEGATIVE
Glucose, UA: NEGATIVE mg/dL
Hgb urine dipstick: NEGATIVE
KETONES UR: NEGATIVE mg/dL
NITRITE: NEGATIVE
PH: 6 (ref 5.0–8.0)
PROTEIN: NEGATIVE mg/dL
Specific Gravity, Urine: 1.015 (ref 1.005–1.030)

## 2018-09-17 LAB — WET PREP, GENITAL
SPERM: NONE SEEN
Trich, Wet Prep: NONE SEEN
YEAST WET PREP: NONE SEEN

## 2018-09-17 LAB — PREGNANCY, URINE: PREG TEST UR: NEGATIVE

## 2018-09-17 NOTE — ED Provider Notes (Signed)
[facility] Provider Note   CSN: [id number] Arrival date & time: 09/17/18  1324     History   Chief Complaint Chief Complaint  Patient presents with  . Pelvic Pain    HPI Renee Sandoval is a 23 y.o. female is here for evaluation of headache and pelvic pain.  She had sudden onset, mild headache yesterday.  It gradually worsened.  It started on the right forehead and radiated to the left forehead and localized to the left eye ball.  Associated with nausea.  No aggravating factors.  She took Tylenol last night and it improved slightly.  Headache went away while waiting in the ER.  She denies previous trauma.  No history of headaches in the past.  She denies associated vision changes, vomiting, neck pain or stiffness, fevers, difficulty with speech or walking, loss of sensation or strength to her extremities.  Pelvic pain has been intermittent and chronic since last fall.  She describes as a sharp pain deep in her vagina, intermittent, bleeding only lasting a few seconds.  It is random and she cannot determine a pattern to it.  Intermittent dyspareunia.  Associated with increased vaginal discharge, dysuria and odor x2 weeks.  She has history of irregular periods LMP 1/6.  She is sexually active with men only without condom use, she had a new partner recently and is concerned about STDs.  States she came to the ER for similar pelvic pain last fall.  They treated her with antibiotics and felt like the pain eased up for a little while but it returned 2 weeks ago.  She has an OB/GYN appointment in 2 weeks.  She denies fevers, chills, vomiting, abnormal vaginal bleeding, changes to bowel movements.  HPI  Past Medical History:  Diagnosis Date  . Irregular menses     There are no active problems to display for this patient.   History reviewed. No pertinent surgical history.   OB History   No obstetric history on file.      Home Medications     Prior to Admission medications   Medication Sig Start Date End Date Taking? Authorizing Provider  benzonatate (TESSALON) 100 MG capsule Take 1 capsule (100 mg total) by mouth every 8 (eight) hours. Patient not taking: Reported on 09/17/2018 08/16/17   Garlon HatchetSanders, Lisa M, PA-C  cephALEXin (KEFLEX) 500 MG capsule Take 1 capsule (500 mg total) by mouth 4 (four) times daily. Patient not taking: Reported on 09/06/2015 05/04/15   Patel-Mills, Lorelle FormosaHanna, PA-C  doxycycline (VIBRAMYCIN) 100 MG capsule Take 1 capsule (100 mg total) by mouth 2 (two) times daily for 14 days. 09/18/18 10/02/18  Liberty HandyGibbons, Roel Douthat J, PA-C  famotidine-calcium carbonate-magnesium hydroxide (PEPCID COMPLETE) 10-800-165 MG chewable tablet Chew 1 tablet by mouth daily as needed. Patient not taking: Reported on 09/17/2018 01/07/18   Linwood DibblesKnapp, Jon, MD  ibuprofen (ADVIL,MOTRIN) 800 MG tablet Take 1 tablet (800 mg total) by mouth 3 (three) times daily. Patient not taking: Reported on 08/16/2017 03/08/16   Elpidio AnisUpstill, Shari, PA-C  naproxen (NAPROSYN) 500 MG tablet Take 1 tablet (500 mg total) by mouth 2 (two) times daily. 09/18/18   Liberty HandyGibbons, Fernando Torry J, PA-C  ondansetron (ZOFRAN ODT) 8 MG disintegrating tablet Take 1 tablet (8 mg total) by mouth every 8 (eight) hours as needed for nausea or vomiting. Patient not taking: Reported on 08/16/2017 07/07/16   Azalia Bilisampos, Kevin, MD  oxymetazoline [facility](AFRIN NASAL SPRAY) 0.05 % nasal spray Place 1 spray into both nostrils 2 (two)  times daily. Patient not taking: Reported on 08/16/2017 10/10/15   Barrett Henle, PA-C    Family History History reviewed. No pertinent family history.  Social History Social History   Tobacco Use  . Smoking status: Never Smoker  . Smokeless tobacco: Never Used  Substance Use Topics  . Alcohol use: Yes    Comment: occ  . Drug use: No     Allergies   Patient has no known allergies.   Review of Systems Review of Systems  Gastrointestinal: Positive for nausea.   Genitourinary: Positive for dysuria, pelvic pain and vaginal pain.  Neurological: Positive for headaches (resolved).  All other systems reviewed and are negative.    Physical Exam Updated Vital Signs BP 112/66 (BP Location: Right Arm)   Pulse 62   Temp 98.3 F (36.8 C) (Oral)   Resp 16   LMP 08/26/2018 (Exact Date)   SpO2 99%   Physical Exam Vitals signs and nursing note reviewed.  Constitutional:      Appearance: She is well-developed.     Comments: Non toxic  HENT:     Head: Normocephalic and atraumatic.     Nose: Nose normal.  Eyes:     Conjunctiva/sclera: Conjunctivae normal.     Pupils: Pupils are equal, round, and reactive to light.  Neck:     Musculoskeletal: Normal range of motion.     Comments: Muscles of the neck are nontender.  Full range of motion of the neck without pain or obvious rigidity. Cardiovascular:     Rate and Rhythm: Normal rate and regular rhythm.  Pulmonary:     Effort: Pulmonary effort is normal.     Breath sounds: Normal breath sounds.  Abdominal:     General: Bowel sounds are normal.     Palpations: Abdomen is soft.     Tenderness: There is no abdominal tenderness.     Comments: No G/R/R. No suprapubic or CVA tenderness. Negative Murphy's and McBurney's. Active BS to lower quadrants.   Genitourinary:    Vagina: Vaginal discharge present.     Cervix: Cervical motion tenderness present.     Comments: RN at bedside during exam.  External genitalia normal without lesions.  No groin lymph node adenopathy.  Milky white discharge in the vaginal vault, positive whiff test.  Cervix is closed without friability or lesions.  Positive CMT.  Nonpalpable, nontender adnexa.  Skin to the perianal region is normal.  Musculoskeletal: Normal range of motion.  Skin:    General: Skin is warm and dry.     Capillary Refill: Capillary refill takes less than 2 seconds.  Neurological:     Mental Status: She is alert and oriented to person, place, and time.      Comments: Alert and oriented to self, place, time and event.  Speech is fluent without obvious dysarthria or aphasia. Strength 5/5 in upper and lower extremities   Sensation to light touch intact in bilateral face, upper and lower extremities Normal gait. No pronator drift. No leg drop.  Normal finger-to-nose, finger and feet tapping.  CN II-XII grossly intact bilaterally.   Psychiatric:        Behavior: Behavior normal.      ED Treatments / Results  Labs (all labs ordered are listed, but only abnormal results are displayed) Labs Reviewed  WET PREP, GENITAL - Abnormal; Notable for the following components:      Result Value   Clue Cells Wet Prep HPF POC PRESENT (*)    WBC, Wet  Prep HPF POC MANY (*)    All other components within normal limits  URINALYSIS, ROUTINE W REFLEX MICROSCOPIC - Abnormal; Notable for the following components:   Leukocytes, UA TRACE (*)    Bacteria, UA RARE (*)    All other components within normal limits  PREGNANCY, URINE  GC/CHLAMYDIA PROBE AMP ([place]) NOT AT [facility]    EKG None  Radiology No results found.  Procedures Procedures (including critical care time)  Medications Ordered in ED Medications  cefTRIAXone (ROCEPHIN) injection 250 mg (250 mg Intramuscular Given 09/18/18 0028)  doxycycline (VIBRA-TABS) tablet 100 mg (100 mg Oral Given 09/18/18 0028)  sterile water (preservative free) injection (2.1 mLs  Given 09/18/18 0028)     Initial Impression / Assessment and Plan / ED Course  I have reviewed the triage vital signs and the nursing notes.  Pertinent labs & imaging results that were available during my care of the patient were reviewed by me and considered in my medical decision making (see chart for details).      Ddx for headache most likely tension type HA vs migraine.  This resolved in ER.  No h/o HA however she is w/o high-risk features of headache including: thunderclap HA, AMS, seizure, history of immunocompromise, neck  rigidity, fever, use of anticoagulation, h/o CVA/TIA or HTN or neuro deficits. On exam VS are WNL, pt is well-appearing w/ no meningismus, nystagmus, focal neuro deficits, pain over temporal arteries.  Given reassuring hx and exam, emergent imaging or labs not indicated given. Low suspicion for emergent intracranial or vascular etiology.    Highest on ddx for pelvic pain is PID although her fleeing intermittent pain is not typical she has high risk sexual practices, increased vaginal dc, CMT. Her symptoms improved after she was treated for PID months ago. Multiple and new partners recently.  She has no abd tenderness, constitutional symptoms and I do not think this is POA.  UA w/o obvious infection, will send for cx bc of dysuria.  I doubt ectopic, torsion given negative pregnancy and chronicity of symptoms.  Will treat for BV, and PID and f/u with OBGYN.  Pt comfortable with this plan. Return precautions given.  Final Clinical Impressions(s) / ED Diagnoses   Final diagnoses:  Bacterial vaginosis  Chronic pelvic pain in female  Pelvic inflammatory disease    ED Discharge Orders         Ordered    doxycycline (VIBRAMYCIN) 100 MG capsule  2 times daily     09/18/18 0025    naproxen (NAPROSYN) 500 MG tablet  2 times daily     09/18/18 0025           Liberty HandyGibbons, Keara Pagliarulo J, PA-C 09/18/18 1138    Melene PlanFloyd, Dan, DO 09/22/18 [phone]

## 2018-09-17 NOTE — ED Triage Notes (Signed)
Pt endorses pelvic pain with vaginal d/c x 2 weeks. Also having headaches. Burning with urination. VSS.

## 2018-09-18 LAB — GC/CHLAMYDIA PROBE AMP (~~LOC~~) NOT AT ARMC
Chlamydia: NEGATIVE
Neisseria Gonorrhea: NEGATIVE

## 2018-09-18 MED ORDER — NAPROXEN 500 MG PO TABS: 500.0000 mg | ORAL_TABLET | Freq: Two times a day (BID) | ORAL | 0 refills | Status: DC

## 2018-09-18 MED ORDER — DOXYCYCLINE HYCLATE 100 MG PO TABS
100.0000 mg | ORAL_TABLET | Freq: Once | ORAL | Status: AC
  Administered 2018-09-18: 100 mg via ORAL
  Filled 2018-09-18: qty 1

## 2018-09-18 MED ORDER — CEFTRIAXONE SODIUM 250 MG IJ SOLR
250.0000 mg | Freq: Once | INTRAMUSCULAR | Status: AC
  Administered 2018-09-18: 250 mg via INTRAMUSCULAR
  Filled 2018-09-18: qty 250

## 2018-09-18 MED ORDER — DOXYCYCLINE HYCLATE 100 MG PO CAPS: 100.0000 mg | ORAL_CAPSULE | Freq: Two times a day (BID) | ORAL | 0 refills | Status: AC

## 2018-09-18 MED ORDER — STERILE WATER FOR INJECTION IJ SOLN
INTRAMUSCULAR | Status: AC
  Administered 2018-09-18: 2.1 mL
  Filled 2018-09-18: qty 10

## 2018-09-18 NOTE — Discharge Instructions (Signed)
You were seen in the ER for chronic pelvic pain, increased vaginal discharge.  On exam today you had pain with movement of your cervix.  Swabs showed bacterial vaginosis.  Take metronidazole for this as prescribed.  This medicine tends to cause nausea and some stomach upset, take on a full stomach.  Do not take alcohol while on this medicine as it can cause severe nausea, vomiting, abdominal pain.  Given your pain during pelvic exam, I suspect your symptoms may be from pelvic inflammatory disease.  Take doxycycline as prescribed.  Use naproxen for the pain and inflammation.  If you need more pain control you can take 500 to 1000 mg of acetaminophen every 6-8 hours.   You need to follow-up with OB/GYN as scheduled.  Return to the ER for fever, chills, vomiting, worsening constant abdominal or pelvic pain, urinary symptoms.

## 2019-03-03 ENCOUNTER — Other Ambulatory Visit: Payer: Self-pay

## 2019-03-03 ENCOUNTER — Ambulatory Visit: Payer: Self-pay | Admitting: Physician Assistant

## 2019-03-03 VITALS — BP 105/65 | HR 86 | Temp 98.4°F | Resp 16 | Ht 63.5 in | Wt 271.4 lb

## 2019-03-03 DIAGNOSIS — Z0289 Encounter for other administrative examinations: Secondary | ICD-10-CM

## 2019-03-03 NOTE — Progress Notes (Signed)
Patient ID: Renee Sandoval DOB: [birth year] AGE: 23 y.o. MRN: [id number]   PCP: Patient, No Pcp Per   Chief Complaint:  Chief Complaint  Patient presents with  . Employment Physical     Subjective:    HPI:  Renee Sandoval is a 23 y.o. female presents for evaluation  Chief Complaint  Patient presents with  . Employment Physical    23 year old female presents to [facility] for a pre-employment / work physical. Patient to start job as CNA with Therapist, art.  Does not have a PCP.  Denies any medical conditions. Does not take any OTC or prescription medications regularly. Will occasionally take OTC Excedrin migraine for migraines; denies missing more work that [employer]. Denies surgical history. No allergies.  Patient denies any concern or need for work restrictions.  Patient with recent TB test, early March. Record on patient's phone. Negative 2-step PPD.  A limited review of symptoms was performed, pertinent positives and negatives as mentioned in HPI.  The following portions of the patient's history were reviewed and updated as appropriate: allergies, current medications and past medical history.  There are no active problems to display for this patient.   No Known Allergies  Current Outpatient Medications on File Prior to Visit  Medication Sig Dispense Refill  . benzonatate (TESSALON) 100 MG capsule Take 1 capsule (100 mg total) by mouth every 8 (eight) hours. (Patient not taking: Reported on 09/17/2018) 21 capsule 0  . cephALEXin (KEFLEX) 500 MG capsule Take 1 capsule (500 mg total) by mouth 4 (four) times daily. (Patient not taking: Reported on 09/06/2015) 20 capsule 0  . famotidine-calcium carbonate-magnesium hydroxide (PEPCID COMPLETE) 10-800-165 MG chewable tablet Chew 1 tablet by mouth daily as needed. (Patient not taking: Reported on 09/17/2018) 60 tablet 1  . ibuprofen (ADVIL,MOTRIN) 800 MG tablet Take 1 tablet (800 mg total) by mouth 3 (three) times  daily. (Patient not taking: Reported on 08/16/2017) 21 tablet 0  . naproxen (NAPROSYN) 500 MG tablet Take 1 tablet (500 mg total) by mouth 2 (two) times daily. (Patient not taking: Reported on 03/03/2019) 30 tablet 0  . ondansetron (ZOFRAN ODT) 8 MG disintegrating tablet Take 1 tablet (8 mg total) by mouth every 8 (eight) hours as needed for nausea or vomiting. (Patient not taking: Reported on 08/16/2017) 10 tablet 0  . oxymetazoline (AFRIN NASAL SPRAY) 0.05 % nasal spray Place 1 spray into both nostrils 2 (two) times daily. (Patient not taking: Reported on 08/16/2017) 30 mL 0   No current facility-administered medications on file prior to visit.        Objective:   Vitals:   03/03/19 1432  BP: 105/65  Pulse: 86  Resp: 16  Temp: 98.4 F (36.9 C)  SpO2: 98%     Wt Readings from Last 3 Encounters:  03/03/19 271 lb 6.4 oz (123.1 kg)  05/17/18 272 lb (123.4 kg)  08/16/17 290 lb (131.5 kg)    Physical Exam:   General Appearance:  Patient sitting comfortably on examination table. Conversational. Kermit Balo self-historian. In no acute distress. Afebrile. Patient overweight/obese.  Head:  Normocephalic, without obvious abnormality, atraumatic  Eyes:  PERRL, conjunctiva/corneas clear, EOM's intact  Ears:  Left ear canal WNL. No erythema or edema. No open wound. No visible purulent drainage. No tenderness with palpation over left tragus or with manipulation of left auricle. No visible erythema or edema of left mastoid. No tenderness with palpation over left mastoid. Right ear canal WNL. No erythema or edema. No open  wound. No visible purulent drainage. No tenderness with palpation over right tragus or with manipulation of right auricle. No visible erythema or edema of right mastoid. No tenderness with palpation over right mastoid. Left TM WNL. Good light reflex. Visible landmarks. No erythema. No injection. No bulging or retraction. No visible perforation. No serous effusion. No visible purulent  effusion. No tympanostomy tube. No scar tissue. Right TM WNL. Good light reflex. Visible landmarks. No erythema. No injection. No bulging or retraction. No visible perforation. No serous effusion. No visible purulent effusion. No tympanostomy tube. No scar tissue.  Nose: Nares normal. Septum midline. No visible polyps. No discharge. Normal mucosa. No sinus tenderness with percussion/palpation.  Throat: Lips, mucosa, and tongue normal; teeth and gums normal. Throat reveals no erythema. No postnasal drip. No visible cobblestoning. Tonsils with no enlargement or exudate. Uvula midline with no edema or erythema.  Neck: Supple, symmetrical, trachea midline, no adenopathy  Lungs:   Clear to auscultation bilaterally, respirations unlabored. Good aeration. No rales, rhonchi, crackles or wheezing.  Heart:  Regular rate and rhythm, S1 and S2 normal, no murmur, rub, or gallop  Abdomen:   Normal to inspection. Normoactive bowel sounds. No tenderness with palpation. No guarding, rigidity or rebound tenderness. No palpable organomegaly.  Extremities: Extremities normal, atraumatic, no cyanosis or edema  Pulses: 2+ and symmetric  Skin: Skin color, texture, turgor normal, no rashes or lesions  Lymph nodes: Cervical, supraclavicular, and axillary nodes normal  Neurologic: Normal. 5/5 upper and lower extremity strength. Good ROM of back; able to bend and almost touch toes. No scoliosis. No gait abnormality. Patient able to squat without difficulty.    Assessment & Plan:    Exam findings, diagnosis etiology and medication use and indications reviewed with patient. Follow-Up and discharge instructions provided. No emergent/urgent issues found on exam.  Patient education was provided.   Patient verbalized understanding of information provided and agrees with plan of care (POC), all questions answered. The patient is advised to call or return to clinic if condition does not see an improvement in symptoms, or to  seek the care of the closest emergency department if condition worsens with the below plan.   1. 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 VSS. Benign physical examination. No concerning medical history. No current medical diagnosis or treatment. Recent negative 2-step PPD. Employee cleared for work with no restrictions (employer required phrase "no communicable diseases").   Janalyn HarderSamantha Elaura Calix, MHS, PA-C Rulon SeraSamantha F. Kizzi Overbey, MHS, PA-C Advanced Practice Provider [facility]  [facility]  [phone] [place] [address] (p): [phone] [email or web address]

## 2019-03-03 NOTE — Patient Instructions (Addendum)
Thank you for choosing [facility] for your health care needs.  You were seen for a work / pre-employment physical examination.  DIAGNOSIS 1. Encounter for physical examination related to employment  Cleared for work with no restrictions.  2-step PPD performed on 3/3 and 3/17. Both negative. Patient with documentation on phone. Patient with no communicable diseases.  Please establish care with a family practice provider for regular evaluation and health care. Return to [place] if work provides you a specific form that needs completed.  Have a great day! Best of luck with the new job.  Health Maintenance, Female Adopting a healthy lifestyle and getting preventive care are important in promoting health and wellness. Ask your health care provider about:  The right schedule for you to have regular tests and exams.  Things you can do on your own to prevent diseases and keep yourself healthy. What should I know about diet, weight, and exercise? Eat a healthy diet   Eat a diet that includes plenty of vegetables, fruits, low-fat dairy products, and lean protein.  Do not eat a lot of foods that are high in solid fats, added sugars, or sodium. Maintain a healthy weight Body mass index (BMI) is used to identify weight problems. It estimates body fat based on height and weight. Your health care provider can help determine your BMI and help you achieve or maintain a healthy weight. Get regular exercise Get regular exercise. This is one of the most important things you can do for your health. Most adults should:  Exercise for at least 150 minutes each week. The exercise should increase your heart rate and make you sweat (moderate-intensity exercise).  Do strengthening exercises at least twice a week. This is in addition to the moderate-intensity exercise.  Spend less time sitting. Even light physical activity can be beneficial. Watch cholesterol and blood lipids Have your blood tested for  lipids and cholesterol at 24 years of age, then have this test every 5 years. Have your cholesterol levels checked more often if:  Your lipid or cholesterol levels are high.  You are older than 24 years of age.  You are at high risk for heart disease. What should I know about cancer screening? Depending on your health history and family history, you may need to have cancer screening at various ages. This may include screening for:  Breast cancer.  Cervical cancer.  Colorectal cancer.  Skin cancer.  Lung cancer. What should I know about heart disease, diabetes, and high blood pressure? Blood pressure and heart disease  High blood pressure causes heart disease and increases the risk of stroke. This is more likely to develop in people who have high blood pressure readings, are of African descent, or are overweight.  Have your blood pressure checked: ? Every 3-5 years if you are 14-64 years of age. ? Every year if you are 40 years old or older. Diabetes Have regular diabetes screenings. This checks your fasting blood sugar level. Have the screening done:  Once every three years after age 68 if you are at a normal weight and have a low risk for diabetes.  More often and at a younger age if you are overweight or have a high risk for diabetes. What should I know about preventing infection? Hepatitis B If you have a higher risk for hepatitis B, you should be screened for this virus. Talk with your health care provider to find out if you are at risk for hepatitis B infection. Hepatitis C Testing  is recommended for:  Everyone born from [birth year] through 1966.  Anyone with known risk factors for hepatitis C. Sexually transmitted infections (STIs)  Get screened for STIs, including gonorrhea and chlamydia, if: ? You are sexually active and are younger than 24 years of age. ? You are older than 24 years of age and your health care provider tells you that you are at risk for this type of  infection. ? Your sexual activity has changed since you were last screened, and you are at increased risk for chlamydia or gonorrhea. Ask your health care provider if you are at risk.  Ask your health care provider about whether you are at high risk for HIV. Your health care provider may recommend a prescription medicine to help prevent HIV infection. If you choose to take medicine to prevent HIV, you should first get tested for HIV. You should then be tested every 3 months for as long as you are taking the medicine. Pregnancy  If you are about to stop having your period (premenopausal) and you may become pregnant, seek counseling before you get pregnant.  Take 400 to 800 micrograms (mcg) of folic acid every day if you become pregnant.  Ask for birth control (contraception) if you want to prevent pregnancy. Osteoporosis and menopause Osteoporosis is a disease in which the bones lose minerals and strength with aging. This can result in bone fractures. If you are 24 years old or older, or if you are at risk for osteoporosis and fractures, ask your health care provider if you should:  Be screened for bone loss.  Take a calcium or vitamin D supplement to lower your risk of fractures.  Be given hormone replacement therapy (HRT) to treat symptoms of menopause. Follow these instructions at home: Lifestyle  Do not use any products that contain nicotine or tobacco, such as cigarettes, e-cigarettes, and chewing tobacco. If you need help quitting, ask your health care provider.  Do not use street drugs.  Do not share needles.  Ask your health care provider for help if you need support or information about quitting drugs. Alcohol use  Do not drink alcohol if: ? Your health care provider tells you not to drink. ? You are pregnant, may be pregnant, or are planning to become pregnant.  If you drink alcohol: ? Limit how much you use to 0-1 drink a day. ? Limit intake if you are breastfeeding.   Be aware of how much alcohol is in your drink. In the [place] one drink equals one 12 oz bottle of beer (355 mL), one 5 oz glass of wine (148 mL), or one 1 oz glass of hard liquor (44 mL). General instructions  Schedule regular health, dental, and eye exams.  Stay current with your vaccines.  Tell your health care provider if: ? You often feel depressed. ? You have ever been abused or do not feel safe at home. Summary  Adopting a healthy lifestyle and getting preventive care are important in promoting health and wellness.  Follow your health care provider's instructions about healthy diet, exercising, and getting tested or screened for diseases.  Follow your health care provider's instructions on monitoring your cholesterol and blood pressure. This information is not intended to replace advice given to you by your health care provider. Make sure you discuss any questions you have with your health care provider. Document Released: 02/20/2011 Document Revised: 07/31/2018 Document Reviewed: 07/31/2018 Elsevier Patient Education  2020 [employer]

## 2021-05-24 ENCOUNTER — Other Ambulatory Visit: Payer: Self-pay

## 2021-05-24 ENCOUNTER — Emergency Department (HOSPITAL_COMMUNITY): Payer: Medicaid Other

## 2021-05-24 ENCOUNTER — Encounter (HOSPITAL_COMMUNITY): Payer: Self-pay | Admitting: *Deleted

## 2021-05-24 ENCOUNTER — Emergency Department (HOSPITAL_COMMUNITY)
Admission: EM | Admit: 2021-05-24 | Discharge: 2021-05-24 | Disposition: A | Discharge status: Home / Self Care | Payer: Medicaid Other | Attending: Emergency Medicine | Admitting: Emergency Medicine

## 2021-05-24 DIAGNOSIS — Z20822 Contact with and (suspected) exposure to covid-19: Secondary | ICD-10-CM | POA: Insufficient documentation

## 2021-05-24 DIAGNOSIS — R079 Chest pain, unspecified: Secondary | ICD-10-CM | POA: Insufficient documentation

## 2021-05-24 DIAGNOSIS — Z5321 Procedure and treatment not carried out due to patient leaving prior to being seen by health care provider: Secondary | ICD-10-CM | POA: Insufficient documentation

## 2021-05-24 DIAGNOSIS — R0602 Shortness of breath: Secondary | ICD-10-CM | POA: Insufficient documentation

## 2021-05-24 LAB — RESP PANEL BY RT-PCR (FLU A&B, COVID) ARPGX2
Influenza A by PCR: NEGATIVE
Influenza B by PCR: NEGATIVE
SARS Coronavirus 2 by RT PCR: NEGATIVE

## 2021-05-24 NOTE — ED Triage Notes (Signed)
Pt c/o chest pain x2 days with Sob. Chest tender on palpation

## 2021-05-24 NOTE — ED Notes (Signed)
Patient told this tech that she was going to leave, this tech encouraged patient to stay, patient said she did not want to wait any longer.

## 2021-05-24 NOTE — ED Provider Notes (Signed)
Emergency Medicine Provider Triage Evaluation Note  Renee Sandoval , a 25 y.o. female  was evaluated in triage.  Pt complains of chest pain x 2 days.  Reports some associated SOB.  Worsened with palpation of chest wall.  States that she feels anxious.  Denies leg swelling, OCPs, hx of PE/DVT.    Review of Systems  Positive: CP, SOB Negative: Fever, chills  Physical Exam  LMP 04/26/2021  Gen:   Awake, no distress   Resp:  Normal effort  MSK:   Moves extremities without difficulty  Other:  Anterior chest wall TTP  Medical Decision Making  Medically screening exam initiated at 12:26 AM.  Appropriate orders placed.  Renee Sandoval was informed that the remainder of the evaluation will be completed by another provider, this initial triage assessment does not replace that evaluation, and the importance of remaining in the ED until their evaluation is complete.  Chest pain   Roxy Horseman, PA-C 05/24/21 Boykin Peek, April, MD 05/24/21 [phone]

## 2021-05-24 NOTE — ED Notes (Signed)
Called pt for vitals, no response.

## 2021-09-06 ENCOUNTER — Emergency Department (HOSPITAL_COMMUNITY)
Admission: EM | Admit: 2021-09-06 | Discharge: 2021-09-07 | Disposition: A | Discharge status: Home / Self Care | Payer: Self-pay | Attending: Emergency Medicine | Admitting: Emergency Medicine

## 2021-09-06 ENCOUNTER — Emergency Department (HOSPITAL_COMMUNITY): Payer: Self-pay

## 2021-09-06 ENCOUNTER — Encounter (HOSPITAL_COMMUNITY): Payer: Self-pay

## 2021-09-06 DIAGNOSIS — R0602 Shortness of breath: Secondary | ICD-10-CM | POA: Insufficient documentation

## 2021-09-06 DIAGNOSIS — Z20822 Contact with and (suspected) exposure to covid-19: Secondary | ICD-10-CM | POA: Insufficient documentation

## 2021-09-06 DIAGNOSIS — R6883 Chills (without fever): Secondary | ICD-10-CM | POA: Insufficient documentation

## 2021-09-06 DIAGNOSIS — R42 Dizziness and giddiness: Secondary | ICD-10-CM | POA: Insufficient documentation

## 2021-09-06 DIAGNOSIS — R0981 Nasal congestion: Secondary | ICD-10-CM | POA: Insufficient documentation

## 2021-09-06 DIAGNOSIS — N9489 Other specified conditions associated with female genital organs and menstrual cycle: Secondary | ICD-10-CM | POA: Insufficient documentation

## 2021-09-06 DIAGNOSIS — R059 Cough, unspecified: Secondary | ICD-10-CM | POA: Insufficient documentation

## 2021-09-06 DIAGNOSIS — J45909 Unspecified asthma, uncomplicated: Secondary | ICD-10-CM | POA: Insufficient documentation

## 2021-09-06 DIAGNOSIS — Z859 Personal history of malignant neoplasm, unspecified: Secondary | ICD-10-CM | POA: Insufficient documentation

## 2021-09-06 LAB — CBC WITH DIFFERENTIAL/PLATELET
Abs Immature Granulocytes: 0.01 10*3/uL (ref 0.00–0.07)
Basophils Absolute: 0 10*3/uL (ref 0.0–0.1)
Basophils Relative: 1 %
Eosinophils Absolute: 0.3 10*3/uL (ref 0.0–0.5)
Eosinophils Relative: 5 %
HCT: 36.1 % (ref 36.0–46.0)
Hemoglobin: 11.9 g/dL — ABNORMAL LOW (ref 12.0–15.0)
Immature Granulocytes: 0 %
Lymphocytes Relative: 37 %
Lymphs Abs: 2.1 10*3/uL (ref 0.7–4.0)
MCH: 26.7 pg (ref 26.0–34.0)
MCHC: 33 g/dL (ref 30.0–36.0)
MCV: 81.1 fL (ref 80.0–100.0)
Monocytes Absolute: 0.5 10*3/uL (ref 0.1–1.0)
Monocytes Relative: 9 %
Neutro Abs: 2.7 10*3/uL (ref 1.7–7.7)
Neutrophils Relative %: 48 %
Platelets: 496 10*3/uL — ABNORMAL HIGH (ref 150–400)
RBC: 4.45 MIL/uL (ref 3.87–5.11)
RDW: 13.8 % (ref 11.5–15.5)
WBC: 5.5 10*3/uL (ref 4.0–10.5)
nRBC: 0 % (ref 0.0–0.2)

## 2021-09-06 LAB — BASIC METABOLIC PANEL
Anion gap: 5 (ref 5–15)
BUN: 10 mg/dL (ref 6–20)
CO2: 25 mmol/L (ref 22–32)
Calcium: 8.7 mg/dL — ABNORMAL LOW (ref 8.9–10.3)
Chloride: 108 mmol/L (ref 98–111)
Creatinine, Ser: 0.76 mg/dL (ref 0.44–1.00)
GFR, Estimated: >60 mL/min (ref >60)
Glucose, Bld: 90 mg/dL (ref 70–99)
Potassium: 3.7 mmol/L (ref 3.5–5.1)
Sodium: 138 mmol/L (ref 135–145)

## 2021-09-06 LAB — RESP PANEL BY RT-PCR (FLU A&B, COVID) ARPGX2
Influenza A by PCR: NEGATIVE
Influenza B by PCR: NEGATIVE
SARS Coronavirus 2 by RT PCR: NEGATIVE

## 2021-09-06 LAB — I-STAT BETA HCG BLOOD, ED (MC, WL, AP ONLY): I-stat hCG, quantitative: <5 m[IU]/mL (ref <5)

## 2021-09-06 MED ORDER — ALBUTEROL SULFATE HFA 108 (90 BASE) MCG/ACT IN AERS
2.0000 | INHALATION_SPRAY | Freq: Once | RESPIRATORY_TRACT | Status: AC
  Administered 2021-09-06: 2 via RESPIRATORY_TRACT
  Filled 2021-09-06: qty 6.7

## 2021-09-06 NOTE — ED Triage Notes (Signed)
Pt arrived via POV, c/o SOB and dizziness. Denies any CP. States she had to go outside to get some air last night.

## 2021-09-06 NOTE — ED Provider Triage Note (Signed)
Emergency Medicine Provider Triage Evaluation Note  Renee Sandoval , a 26 y.o. female  was evaluated in triage.  Pt complains of shortness of breath that woke her up out of her sleep last night.  Patient states that she felt so short of breath she needed to go outside and get fresh air.  She states that this morning she began feeling lightheaded.  She also reports shortness of breath worse with exertion and laying flat.  She does work around [employer] individuals and is unsure if she could have COVID.  She states she has mild chills however denies fevers.  Denies any cough or body aches.  She denies any history of DVT or PE.  No recent prolonged travel or immobilization.  No hemoptysis.  No active malignancy.  No exogenous hormone use.  Denies any chest pain specifically.  Review of Systems  Positive: + SOB, lightheadedness Negative: - chest pain, leg swelling, cough, fever  Physical Exam  BP (!) 139/99 (BP Location: Left Arm)    Pulse 84    Temp 97.7 F (36.5 C) (Oral)    Resp 20    SpO2 100%  Gen:   Awake, no distress   Resp:  Normal effort  MSK:   Moves extremities without difficulty  Other:    Medical Decision Making  Medically screening exam initiated at 3:34 PM.  Appropriate orders placed.  Chiyeko Nevils was informed that the remainder of the evaluation will be completed by another provider, this initial triage assessment does not replace that evaluation, and the importance of remaining in the ED until their evaluation is complete.     Tanda Rockers, PA-C 09/06/21 1535

## 2021-09-07 ENCOUNTER — Emergency Department (HOSPITAL_COMMUNITY): Payer: Self-pay

## 2021-09-07 ENCOUNTER — Encounter (HOSPITAL_COMMUNITY): Payer: Self-pay | Admitting: Student

## 2021-09-07 LAB — D-DIMER, QUANTITATIVE: D-Dimer, Quant: 0.51 ug/mL-FEU — ABNORMAL HIGH (ref 0.00–0.50)

## 2021-09-07 MED ORDER — IOHEXOL 350 MG/ML SOLN
100.0000 mL | Freq: Once | INTRAVENOUS | Status: AC | PRN
  Administered 2021-09-07: 100 mL via INTRAVENOUS

## 2021-09-07 MED ORDER — SODIUM CHLORIDE (PF) 0.9 % IJ SOLN
INTRAMUSCULAR | Status: AC
  Filled 2021-09-07: qty 50

## 2021-09-07 NOTE — Discharge Instructions (Signed)
You were seen in the ER today for trouble breathing with lightheadedness.  Your results from your visit are detailed below- you have very mild anemia, mild elevation in your platelets and your calcium is very mildly low.  Your CT scan did not show a blood clot.  Please use the inhaler 1-2 puffs every 4-6 hours as needed for trouble breathing/wheezing.   Follow up with primary care and or pulmonology as soon as possible for re-assessment and consideration of pulmonary function tests. Given you wake up short of breath sometimes in the middle of the night you may benefit from a sleep study- discuss this with your primary care provider as well.   Return to the ER for new or worsening symptoms including but not limited to new or worsening pain, trouble breathing not alleviated with inhaler use, worsened breathing problems, coughing up blood, passing out, fever, or any other concerns.   Results for orders placed or performed during the hospital encounter of 09/06/21  Resp Panel by RT-PCR (Flu A&B, Covid) Nasopharyngeal Swab   Specimen: Nasopharyngeal Swab; Nasopharyngeal(NP) swabs in vial transport medium  Result Value Ref Range   SARS Coronavirus 2 by RT PCR NEGATIVE NEGATIVE   Influenza A by PCR NEGATIVE NEGATIVE   Influenza B by PCR NEGATIVE NEGATIVE  Basic metabolic panel  Result Value Ref Range   Sodium 138 135 - 145 mmol/L   Potassium 3.7 3.5 - 5.1 mmol/L   Chloride 108 98 - 111 mmol/L   CO2 25 22 - 32 mmol/L   Glucose, Bld 90 70 - 99 mg/dL   BUN 10 6 - 20 mg/dL   Creatinine, Ser 1.61 0.44 - 1.00 mg/dL   Calcium 8.7 (L) 8.9 - 10.3 mg/dL   GFR, Estimated >09 >60 mL/min   Anion gap 5 5 - 15  CBC with Differential  Result Value Ref Range   WBC 5.5 4.0 - 10.5 K/uL   RBC 4.45 3.87 - 5.11 MIL/uL   Hemoglobin 11.9 (L) 12.0 - 15.0 g/dL   HCT 45.4 09.8 - 11.9 %   MCV 81.1 80.0 - 100.0 fL   MCH 26.7 26.0 - 34.0 pg   MCHC 33.0 30.0 - 36.0 g/dL   RDW 14.7 82.9 - 56.2 %   Platelets 496 (H)  150 - 400 K/uL   nRBC 0.0 0.0 - 0.2 %   Neutrophils Relative % 48 %   Neutro Abs 2.7 1.7 - 7.7 K/uL   Lymphocytes Relative 37 %   Lymphs Abs 2.1 0.7 - 4.0 K/uL   Monocytes Relative 9 %   Monocytes Absolute 0.5 0.1 - 1.0 K/uL   Eosinophils Relative 5 %   Eosinophils Absolute 0.3 0.0 - 0.5 K/uL   Basophils Relative 1 %   Basophils Absolute 0.0 0.0 - 0.1 K/uL   Immature Granulocytes 0 %   Abs Immature Granulocytes 0.01 0.00 - 0.07 K/uL  D-dimer, quantitative  Result Value Ref Range   D-Dimer, Quant 0.51 (H) 0.00 - 0.50 ug/mL-FEU  I-Stat Beta hCG blood, ED ([facility], [facility], [facility] only)  Result Value Ref Range   I-stat hCG, quantitative <5.0 <5 mIU/mL   Comment 3           DG Chest 2 View  Result Date: 09/06/2021 CLINICAL DATA:  Shortness of breath. EXAM: CHEST - 2 VIEW COMPARISON:  05/24/2021 FINDINGS: The heart size and mediastinal contours are within normal limits. Both lungs are clear. The visualized skeletal structures are unremarkable. IMPRESSION: No active cardiopulmonary disease. Electronically  Signed   By: Norva Pavlov M.D.   On: 09/06/2021 15:56   CT Angio Chest PE W/Cm &/Or Wo Cm  Result Date: 09/07/2021 CLINICAL DATA:  Concern for pulmonary embolism. EXAM: CT ANGIOGRAPHY CHEST WITH CONTRAST TECHNIQUE: Multidetector CT imaging of the chest was performed using the standard protocol during bolus administration of intravenous contrast. Multiplanar CT image reconstructions and MIPs were obtained to evaluate the vascular anatomy. RADIATION DOSE REDUCTION: This exam was performed according to the departmental dose-optimization program which includes automated exposure control, adjustment of the mA and/or kV according to patient size and/or use of iterative reconstruction technique. CONTRAST:  OMNIPAQUE IOHEXOL 350 MG/ML SOLN COMPARISON:  Chest radiograph dated 09/06/2021. FINDINGS: Evaluation of this exam is limited due to respiratory motion artifact. Cardiovascular: There is no  cardiomegaly or pericardial effusion. The thoracic aorta is unremarkable. The origins of the great vessels of the aortic arch appear patent as visualized. Evaluation of the pulmonary arteries is limited due to respiratory motion artifact and suboptimal opacification and visualization of the peripheral branches. No large or central pulmonary artery embolus identified. Mediastinum/Nodes: No hilar or mediastinal adenopathy. The esophagus is grossly unremarkable. No mediastinal fluid collection. Lungs/Pleura: The lungs are clear. There is no pleural effusion pneumothorax. The central airways are patent. Upper Abdomen: No acute abnormality. Musculoskeletal: No chest wall abnormality. No acute or significant osseous findings. Review of the MIP images confirms the above findings. IMPRESSION: No acute intrathoracic pathology. No CT evidence of central pulmonary artery embolus. Electronically Signed   By: Elgie Collard M.D.   On: 09/07/2021 02:52

## 2021-09-07 NOTE — ED Provider Notes (Signed)
[facility]-EMERGENCY DEPT Provider Note   CSN: [id number] Arrival date & time: 09/06/21  1447     History  Chief Complaint  Patient presents with   Shortness of Breath   Dizziness    Renee Sandoval is a 26 y.o. female without significant pmhx who presents to the ED with complaints of dyspnea that began last night. Patient reports she woke up short of breath in the middle of the night and had to get up and go outside to catch her breath. She has had dyspnea throughout the day today with intermittent lightheadedness when she bends over at times. No other alleviating/aggravating factors. Has had some chills, congestion and dry cough. Currently on her menstrual cycle. Denies fever, vomiting, melena, syncope, chest pain, leg pain/swelling, hemoptysis, recent surgery/trauma, recent long travel, hormone use, personal hx of cancer, or hx of DVT/PE. She does report hx of asthma as a child per her mother.    HPI     Home Medications Prior to Admission medications   Medication Sig Start Date End Date Taking? Authorizing Provider  benzonatate (TESSALON) 100 MG capsule Take 1 capsule (100 mg total) by mouth every 8 (eight) hours. Patient not taking: Reported on 09/17/2018 08/16/17   Garlon Hatchet, PA-C  cephALEXin (KEFLEX) 500 MG capsule Take 1 capsule (500 mg total) by mouth 4 (four) times daily. Patient not taking: Reported on 09/06/2015 05/04/15   Catha Gosselin, PA-C  famotidine-calcium carbonate-magnesium hydroxide (PEPCID COMPLETE) 10-800-165 MG chewable tablet Chew 1 tablet by mouth daily as needed. Patient not taking: Reported on 09/17/2018 01/07/18   Linwood Dibbles, MD  ibuprofen (ADVIL,MOTRIN) 800 MG tablet Take 1 tablet (800 mg total) by mouth 3 (three) times daily. Patient not taking: Reported on 08/16/2017 03/08/16   Elpidio Anis, PA-C  naproxen (NAPROSYN) 500 MG tablet Take 1 tablet (500 mg total) by mouth 2 (two) times daily. Patient not taking: Reported on  03/03/2019 09/18/18   Liberty Handy, PA-C  ondansetron (ZOFRAN ODT) 8 MG disintegrating tablet Take 1 tablet (8 mg total) by mouth every 8 (eight) hours as needed for nausea or vomiting. Patient not taking: Reported on 08/16/2017 07/07/16   Azalia Bilis, MD  oxymetazoline [facility] NASAL SPRAY) 0.05 % nasal spray Place 1 spray into both nostrils 2 (two) times daily. Patient not taking: Reported on 08/16/2017 10/10/15   Barrett Henle, PA-C      Allergies    Patient has no known allergies.    Review of Systems   Review of Systems  Constitutional:  Positive for chills. Negative for fever.  Respiratory:  Positive for cough and shortness of breath.   Cardiovascular:  Negative for chest pain and leg swelling.  Gastrointestinal:  Negative for abdominal pain, blood in stool and vomiting.  Genitourinary:  Positive for vaginal bleeding (menstruating). Negative for dysuria.  Neurological:  Positive for light-headedness. Negative for syncope.  All other systems reviewed and are negative.  Physical Exam Updated Vital Signs BP (!) 161/91    Pulse 77    Temp 97.7 F (36.5 C) (Oral)    Resp 18    SpO2 100%  Physical Exam Vitals and nursing note reviewed.  Constitutional:      General: She is not in acute distress.    Appearance: She is well-developed. She is not toxic-appearing.  HENT:     Head: Normocephalic and atraumatic.  Eyes:     General:        Right eye: No discharge.  Left eye: No discharge.     Conjunctiva/sclera: Conjunctivae normal.  Cardiovascular:     Rate and Rhythm: Normal rate and regular rhythm.  Pulmonary:     Effort: Pulmonary effort is normal. No respiratory distress.     Breath sounds: Normal breath sounds. No wheezing, rhonchi or rales.  Abdominal:     General: There is no distension.     Palpations: Abdomen is soft.     Tenderness: There is no abdominal tenderness.  Musculoskeletal:     Cervical back: Neck supple.     Right lower leg: No  tenderness. No edema.     Left lower leg: No tenderness. No edema.  Skin:    General: Skin is warm and dry.     Findings: No rash.  Neurological:     Mental Status: She is alert.     Comments: Clear speech.   Psychiatric:        Behavior: Behavior normal.    ED Results / Procedures / Treatments   Labs (all labs ordered are listed, but only abnormal results are displayed) Labs Reviewed  BASIC METABOLIC PANEL - Abnormal; Notable for the following components:      Result Value   Calcium 8.7 (*)    All other components within normal limits  CBC WITH DIFFERENTIAL/PLATELET - Abnormal; Notable for the following components:   Hemoglobin 11.9 (*)    Platelets 496 (*)    All other components within normal limits  RESP PANEL BY RT-PCR (FLU A&B, COVID) ARPGX2  D-DIMER, QUANTITATIVE  I-STAT BETA HCG BLOOD, ED ([facility], [facility], [facility] ONLY)    EKG None  Radiology DG Chest 2 View  Result Date: 09/06/2021 CLINICAL DATA:  Shortness of breath. EXAM: CHEST - 2 VIEW COMPARISON:  05/24/2021 FINDINGS: The heart size and mediastinal contours are within normal limits. Both lungs are clear. The visualized skeletal structures are unremarkable. IMPRESSION: No active cardiopulmonary disease. Electronically Signed   By: Norva Pavlov M.D.   On: 09/06/2021 15:56   CT Angio Chest PE W/Cm &/Or Wo Cm  Result Date: 09/07/2021 CLINICAL DATA:  Concern for pulmonary embolism. EXAM: CT ANGIOGRAPHY CHEST WITH CONTRAST TECHNIQUE: Multidetector CT imaging of the chest was performed using the standard protocol during bolus administration of intravenous contrast. Multiplanar CT image reconstructions and MIPs were obtained to evaluate the vascular anatomy. RADIATION DOSE REDUCTION: This exam was performed according to the departmental dose-optimization program which includes automated exposure control, adjustment of the mA and/or kV according to patient size and/or use of iterative reconstruction technique. CONTRAST:   OMNIPAQUE IOHEXOL 350 MG/ML SOLN COMPARISON:  Chest radiograph dated 09/06/2021. FINDINGS: Evaluation of this exam is limited due to respiratory motion artifact. Cardiovascular: There is no cardiomegaly or pericardial effusion. The thoracic aorta is unremarkable. The origins of the great vessels of the aortic arch appear patent as visualized. Evaluation of the pulmonary arteries is limited due to respiratory motion artifact and suboptimal opacification and visualization of the peripheral branches. No large or central pulmonary artery embolus identified. Mediastinum/Nodes: No hilar or mediastinal adenopathy. The esophagus is grossly unremarkable. No mediastinal fluid collection. Lungs/Pleura: The lungs are clear. There is no pleural effusion pneumothorax. The central airways are patent. Upper Abdomen: No acute abnormality. Musculoskeletal: No chest wall abnormality. No acute or significant osseous findings. Review of the MIP images confirms the above findings. IMPRESSION: No acute intrathoracic pathology. No CT evidence of central pulmonary artery embolus. Electronically Signed   By: Elgie Collard M.D.   On:  09/07/2021 02:52    Procedures Procedures    Medications Ordered in ED Medications  sodium chloride (PF) 0.9 % injection (has no administration in time range)  albuterol (VENTOLIN HFA) 108 (90 Base) MCG/ACT inhaler 2 puff (2 puffs Inhalation Given 09/06/21 2327)  iohexol (OMNIPAQUE) 350 MG/ML injection 100 mL (100 mLs Intravenous Contrast Given 09/07/21 0236)    ED Course/ Medical Decision Making/ A&P                           Medical Decision Making Amount and/or Complexity of Data Reviewed Labs: ordered. Radiology: ordered.  Risk Prescription drug management.   Patient presents to the ED with complaints of shortness of breath, this involves an extensive number of treatment options, and is a complaint that carries with it a high risk of complications and morbidity. Nontoxic, vitals w/  somewhat elevated BP. Exam benign.    Additional history obtained:  Chart reviewed & nursing notes reviewed for additional hx.   EKG: Sinus rhythm.   Lab Tests:  I reviewed and interpreted labs, pertinent results include:  CBC: Mild anemia, mildly elevated platelet count is similar to prior BMP: Mild hypocalcemia Pregnancy test: Negative COVID/influenza testing: Negative  ED Course:   I have added on a D-dimer which returned positive therefore CT angio of the chest was ordered. Given patient with history of childhood asthma per her mother's report to her will trial albuterol inhaler.  Imaging Studies ordered:  I ordered imaging studies which included CTA of the chest,  I independently reviewed & interpreted imaging & am in agreement with radiology impression which shows: No acute intrathoracic pathology. No CT evidence of central pulmonary artery embolus  On re-assessment patient feels much better, states inhaler resolved her sxs, she remains well appearing without hypoxia. No PE, pneumonia, or pulmonary edema on chest imaging. No critical anemia. No noted arrhythmias. She overall appears appropriate for discharge, will provide inhaler to go home with- recommended PCP/pulm follow up for PFTs, she also notes she has intermittently woken up short of breath in the middle of the night previously- may benefit from sleep study which was discussed with her. I discussed results, treatment plan, need for follow-up, and return precautions with the patient. Provided opportunity for questions, patient confirmed understanding and is in agreement with plan.   Portions of this note were generated with Scientist, clinical (histocompatibility and immunogenetics)Dragon dictation software. Dictation errors may occur despite best attempts at proofreading.         Final Clinical Impression(s) / ED Diagnoses Final diagnoses:  Shortness of breath    Rx / DC Orders ED Discharge Orders     None         Cherly Andersonetrucelli, Haasini Patnaude R, PA-C 09/07/21 0404     Tilden Fossaees, Elizabeth, MD 09/07/21 [phone]

## 2022-08-20 IMAGING — CT CT ANGIO CHEST
2 of 6 series · 18 of 36 positions shown · IV contrast (OMNIPAQUE 350)
Comparison: Chest radiograph dated 09/06/2021.

CLINICAL DATA: Concern for pulmonary embolism.

EXAM:
CT ANGIOGRAPHY CHEST WITH CONTRAST
TECHNIQUE: Multidetector CT imaging of the chest was performed using the
standard protocol during bolus administration of intravenous
contrast. Multiplanar CT image reconstructions and MIPs were
obtained to evaluate the vascular anatomy.

[Series 6: thins · axial · 0.62mm/px · z∈[+1455,+1677]mm · 17 of 250 slices shown]
[im 14/250  lung]
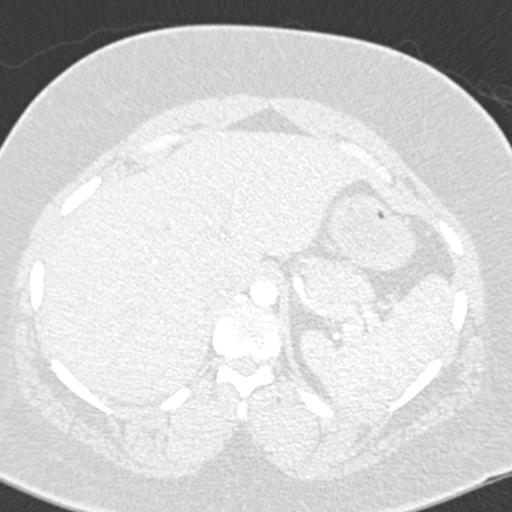
[im 28/250  mediastinal]
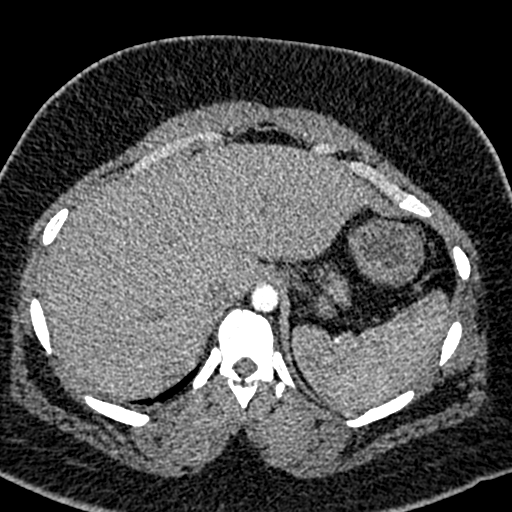
[im 42/250  lung]
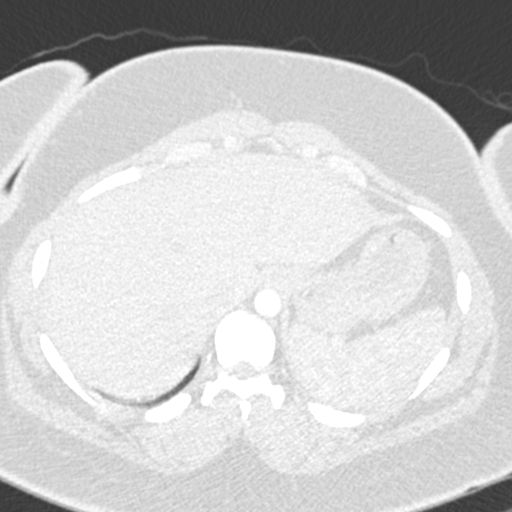
[im 56/250  mediastinal]
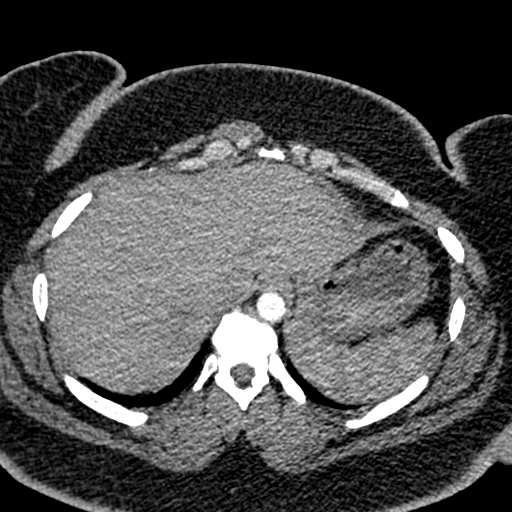
[im 70/250  lung]
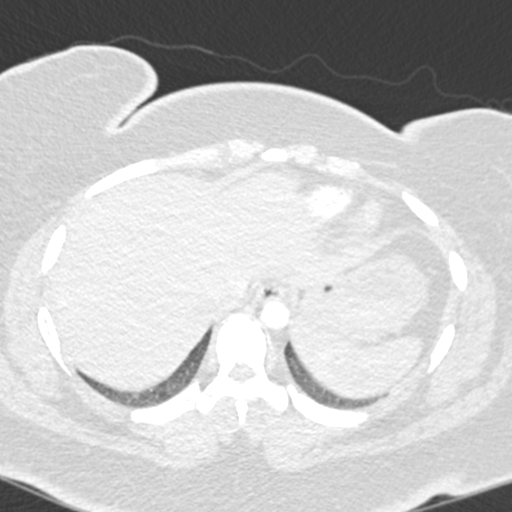
[im 84/250  mediastinal]
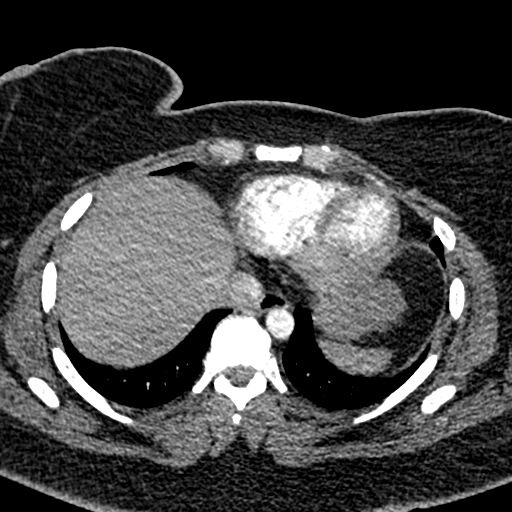
[im 97/250  lung]
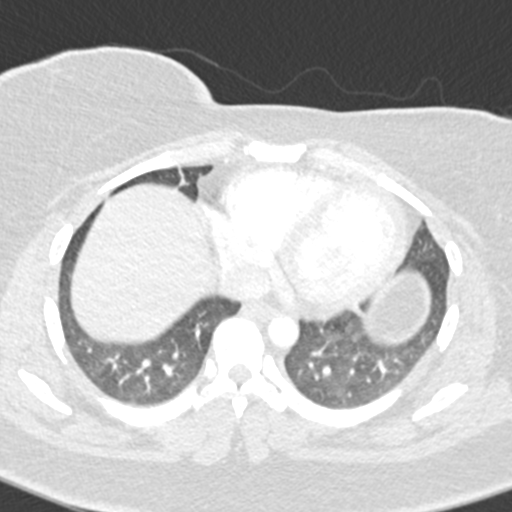
[im 111/250  mediastinal]
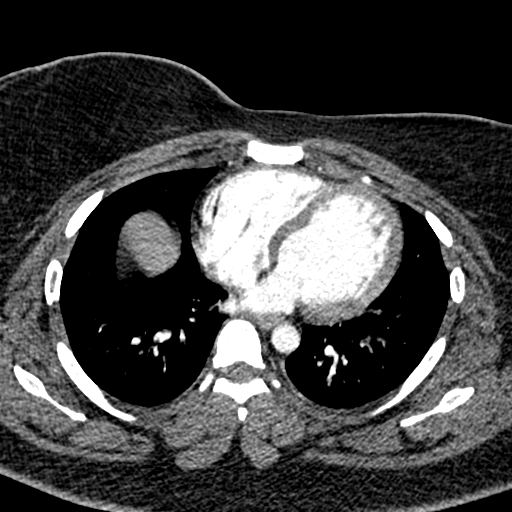
[im 125/250  lung]
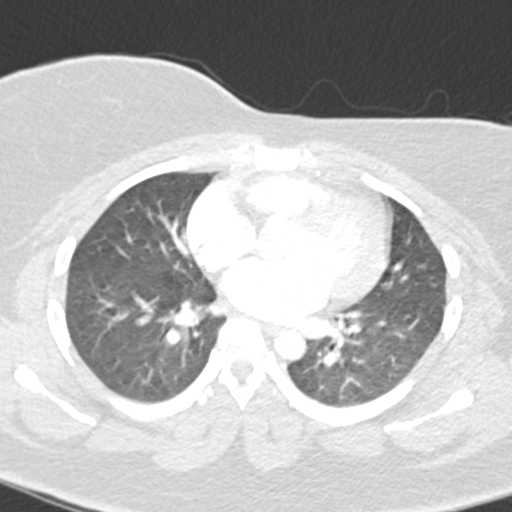
[im 139/250  mediastinal]
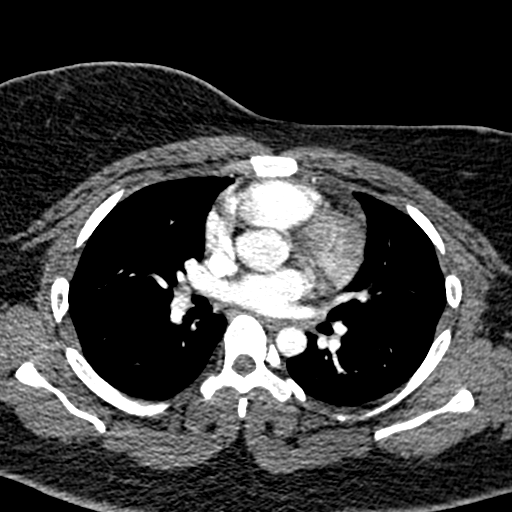
[im 153/250  lung]
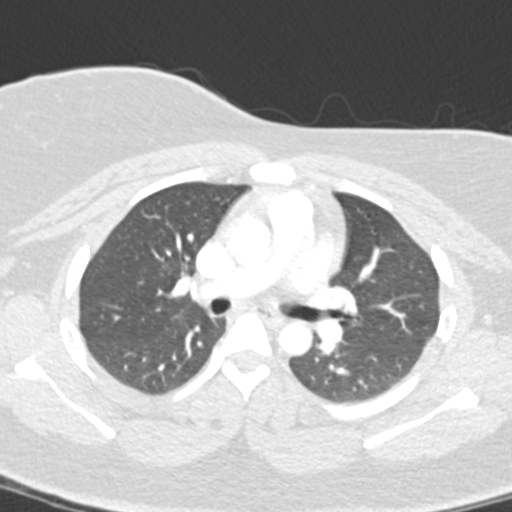
[im 167/250  mediastinal]
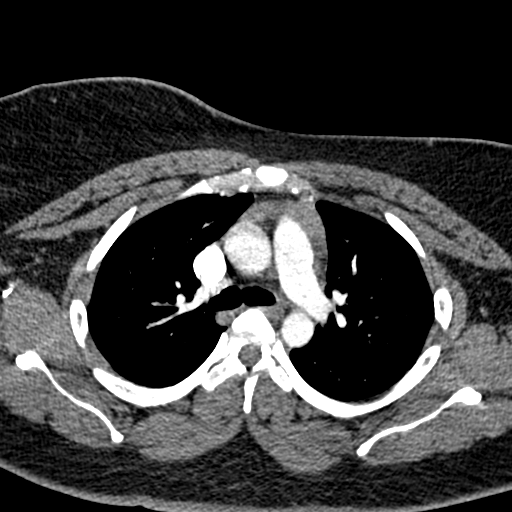
[im 180/250  lung]
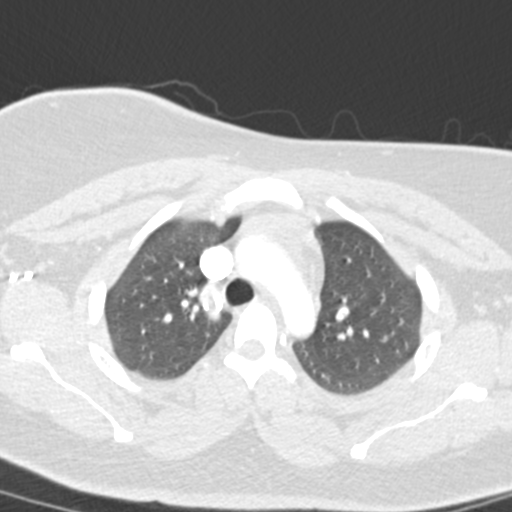
[im 194/250  mediastinal]
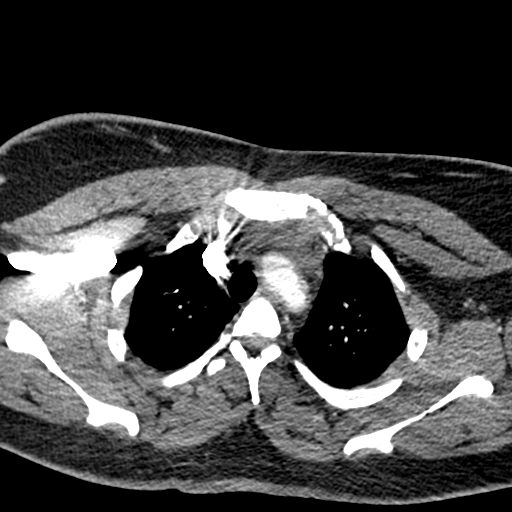
[im 208/250  lung]
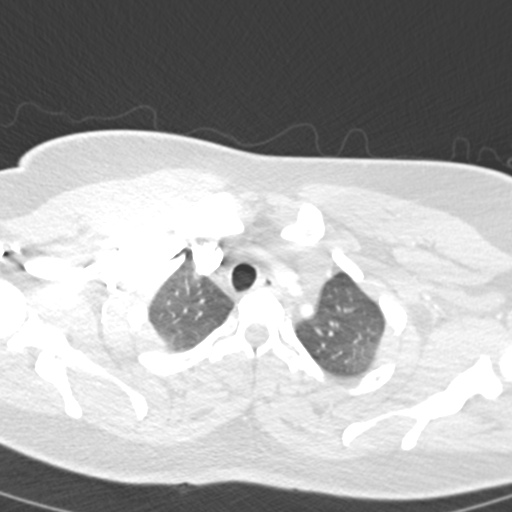
[im 222/250  mediastinal]
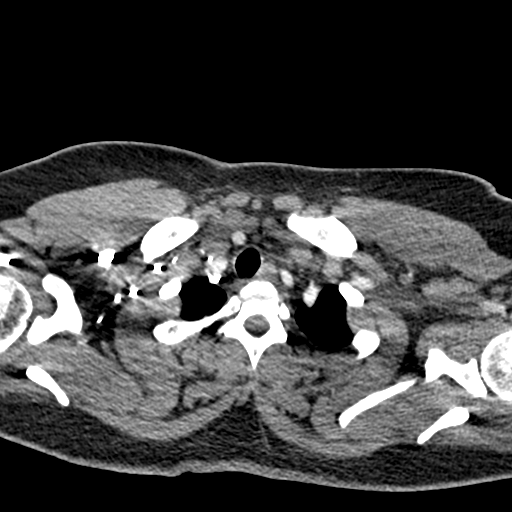
[im 236/250  lung]
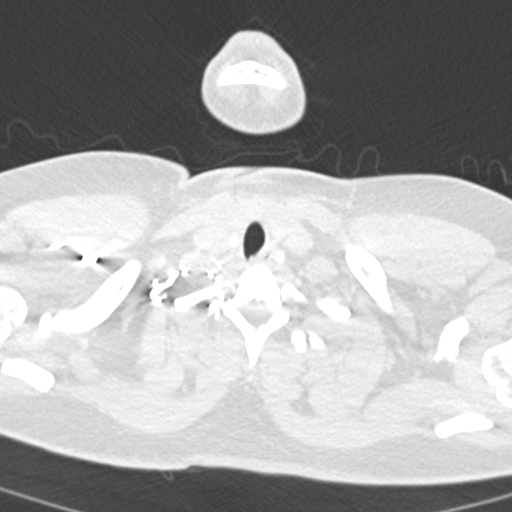

[Series 8: coronal mpr · coronal · 0.50mm/px · 1 of 141 slices shown]
[im 71/141  mediastinal]
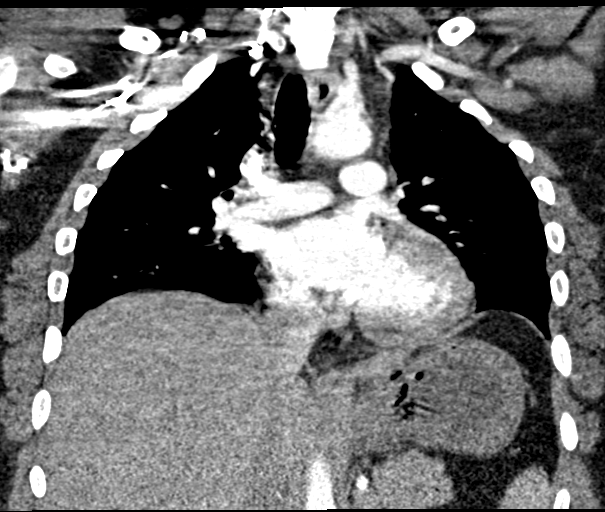

[18 of 36 positions shown; findings below may reference images not displayed]

RADIATION DOSE REDUCTION: This exam was performed according to the
departmental dose-optimization program which includes automated
exposure control, adjustment of the mA and/or kV according to
patient size and/or use of iterative reconstruction technique.

CONTRAST:  100mL OMNIPAQUE IOHEXOL 350 MG/ML SOLN
FINDINGS: Evaluation of this exam is limited due to respiratory motion
artifact.

Cardiovascular: There is no cardiomegaly or pericardial effusion.
The thoracic aorta is unremarkable. The origins of the great vessels
of the aortic arch appear patent as visualized. Evaluation of the
pulmonary arteries is limited due to respiratory motion artifact and
suboptimal opacification and visualization of the peripheral
branches. No large or central pulmonary artery embolus identified.

Mediastinum/Nodes: No hilar or mediastinal adenopathy. The esophagus
is grossly unremarkable. No mediastinal fluid collection.

Lungs/Pleura: The lungs are clear. There is no pleural effusion
pneumothorax. The central airways are patent.

Upper Abdomen: No acute abnormality.

Musculoskeletal: No chest wall abnormality. No acute or significant
osseous findings.

Review of the MIP images confirms the above findings.
IMPRESSION: No acute intrathoracic pathology. No CT evidence of central
pulmonary artery embolus.

## 2023-05-03 ENCOUNTER — Emergency Department (HOSPITAL_COMMUNITY)
Admission: EM | Admit: 2023-05-03 | Discharge: 2023-05-03 | Disposition: A | Discharge status: Home / Self Care | Payer: Medicaid Other | Attending: Emergency Medicine | Admitting: Emergency Medicine

## 2023-05-03 ENCOUNTER — Encounter (HOSPITAL_COMMUNITY): Payer: Self-pay | Admitting: Emergency Medicine

## 2023-05-03 DIAGNOSIS — X58XXXA Exposure to other specified factors, initial encounter: Secondary | ICD-10-CM | POA: Insufficient documentation

## 2023-05-03 DIAGNOSIS — S025XXA Fracture of tooth (traumatic), initial encounter for closed fracture: Secondary | ICD-10-CM | POA: Diagnosis not present

## 2023-05-03 DIAGNOSIS — K0889 Other specified disorders of teeth and supporting structures: Secondary | ICD-10-CM | POA: Insufficient documentation

## 2023-05-03 MED ORDER — KETOROLAC TROMETHAMINE 60 MG/2ML IM SOLN
30.0000 mg | Freq: Once | INTRAMUSCULAR | Status: AC
  Administered 2023-05-03: 30 mg via INTRAMUSCULAR
  Filled 2023-05-03: qty 2

## 2023-05-03 MED ORDER — AMOXICILLIN-POT CLAVULANATE 875-125 MG PO TABS: 1.0000 | ORAL_TABLET | Freq: Two times a day (BID) | ORAL | 0 refills | Status: AC

## 2023-05-03 NOTE — ED Provider Notes (Signed)
EMERGENCY DEPARTMENT AT [facility] Provider Note  CSN: [id number] Arrival date & time: 05/03/23 0205  Chief Complaint(s) Dental Pain  HPI Renee Sandoval is a 27 y.o. female who presents to the emergency department for 2 days of dental pain.  Reports that she cracked a right upper molar.  States she has a follow-up with a dentist but was not able to take the pain this evening.  States that she has been taking Tylenol Motrin which initially was providing pain relief but then not this evening prompting her visit.  The history is provided by the patient.    Past Medical History Past Medical History:  Diagnosis Date   Irregular menses    There are no problems to display for this patient.  Home Medication(s) Prior to Admission medications   Medication Sig Start Date End Date Taking? Authorizing Provider  amoxicillin-clavulanate (AUGMENTIN) 875-125 MG tablet Take 1 tablet by mouth every 12 (twelve) hours. 05/03/23  Yes Taetum Flewellen, Amadeo Garnet, MD                                                                                                                                    Allergies Patient has no known allergies.  Review of Systems Review of Systems As noted in HPI  Physical Exam Vital Signs  I have reviewed the triage vital signs BP 138/88   Pulse 69   Temp 98 F (36.7 C)   Resp 17   SpO2 98%   Physical Exam Vitals reviewed.  Constitutional:      General: She is not in acute distress.    Appearance: She is well-developed. She is not diaphoretic.  HENT:     Head: Normocephalic and atraumatic.     Right Ear: External ear normal.     Left Ear: External ear normal.     Nose: Nose normal.     Mouth/Throat:     Mouth: No angioedema.     Dentition: Abnormal dentition. Dental tenderness present. No dental abscesses.   Eyes:     General: No scleral icterus.    Conjunctiva/sclera: Conjunctivae normal.  Neck:     Trachea: Phonation normal.   Cardiovascular:     Rate and Rhythm: Normal rate and regular rhythm.  Pulmonary:     Effort: Pulmonary effort is normal. No respiratory distress.     Breath sounds: No stridor.  Abdominal:     General: There is no distension.  Musculoskeletal:        General: Normal range of motion.     Cervical back: Normal range of motion.  Neurological:     Mental Status: She is alert and oriented to person, place, and time.  Psychiatric:        Behavior: Behavior normal.     ED Results and Treatments Labs (all labs ordered are listed, but only abnormal results are displayed) Labs Reviewed - No data to display  EKG  EKG Interpretation Date/Time:    Ventricular Rate:    PR Interval:    QRS Duration:    QT Interval:    QTC Calculation:   R Axis:      Text Interpretation:         Radiology No results found.  Medications Ordered in ED Medications  ketorolac (TORADOL) injection 30 mg (30 mg Intramuscular Given 05/03/23 0501)   Procedures Procedures  (including critical care time) Medical Decision Making / ED Course   Medical Decision Making Risk Prescription drug management.    Dental pain due to tooth fracture.  No obvious abscess or deep tissue infection noted.  Will treat for possible pulpitis. IM Toradol for pain.  Rx Augmentin.    Final Clinical Impression(s) / ED Diagnoses Final diagnoses:  Closed fracture of tooth, initial encounter  Pain, dental   The patient appears reasonably screened and/or stabilized for discharge and I doubt any other medical condition or other [facility] requiring further screening, evaluation, or treatment in the ED at this time. I have discussed the findings, Dx and Tx plan with the patient/family who expressed understanding and agree(s) with the plan. Discharge instructions discussed at length. The patient/family was given strict  return precautions who verbalized understanding of the instructions. No further questions at time of discharge.  Disposition: Discharge  Condition: Good  ED Discharge Orders          Ordered    amoxicillin-clavulanate (AUGMENTIN) 875-125 MG tablet  Every 12 hours        05/03/23 0425              Follow Up: Dentist  Go to  as scheduled    This chart was dictated using voice recognition software.  Despite best efforts to proofread,  errors can occur which can change the documentation meaning.    Nira Conn, MD 05/03/23 [phone]

## 2023-05-03 NOTE — ED Triage Notes (Signed)
Pt has mild swelling to R cheek and reports pain for a week. Called dentist but cannot get in until next week. Has been taking tylenol but pain too great and she is unable to sleep.

## 2024-02-05 NOTE — Progress Notes (Signed)
 Patient ID:  Renee Sandoval is a 28 y.o. (DOB Sep 19, 1995) female  Provider licensed to provide medical care in the location/state of patient:  Yes Patient Location:  home, [place] Provider Location: [place]  Encounter took place via Scientist, clinical (histocompatibility and immunogenetics)  Consent:  - Patient's identity was confirmed.  - Medical condition or illness was discussed with the patient/personal representative.  - Current proposed treatment for medical condition or illness was explained to patient/personal representative along with the likely benefits, significant risks and complications associated with the treatment.  - The patient/personal representative verbally authorized treatment to be provided by audio, which may include a limited review of patient's current health status, medication or other treatment recommendations, patient education and an opportunity to ask questions about condition and treatment.   Verbal Consent granted: Yes  Impression   No diagnosis found.  Plan   Assessment & Plan 1. Obesity  - Insurance has declined coverage for [facility], citing the absence of BMI in the documentation. - BMI is within the acceptable range for [facility]. - Prior authorization for Georjean will be resubmitted to the insurance provider. [place] Georjean, a GLP-1 medication, aids in weight loss and insulin control, potentially enhancing fertility. If resubmission is unsuccessful, alternative treatment options will be considered.   No orders of the defined types were placed in this encounter.   Current Outpatient Medications  Medication Instructions  . albuterol  HFA (PROVENTIL  HFA;VENTOLIN  HFA;PROAIR  HFA) 90 mcg/actuation inhaler 2 puffs, inhalation, Every 6 hours PRN  . ferrous sulfate 325 mg, Daily before breakfast  . fluconazole (DIFLUCAN) 150 mg tablet Take one tablet by day every three days  . metroNIDAZOLE (FLAGYL) 500 mg, oral, 2 times daily, Do not drink alcohol while taking this medication  . Wegovy 0.25 mg,  subcutaneous, Every 7 days     Risks, benefits, and alternatives of the medications and treatment plan prescribed today were discussed, and patient expressed understanding. Plan follow-up as discussed or as needed if any worsening symptoms or change in condition.    Subjective   Chief Complaint  Patient presents with  . Weight Loss    Insurance denied wegovy    History of Present Illness The patient is a 28 year old female who presents today via virtual visit to discuss why her Georjean was denied by her insurance.  When reviewing denial letter it appears the insurance did not make note of the BMI that was listed.     Past Medical History, Past Surgery History, Allergies, Social History, and Family History were reviewed and updated.    Review of Systems is complete and negative except as noted.  Objective   General:  Well developed, Well nourished, No distress, Non toxic or ill appearing  Psych: Mood, thought content, behavior and judgment normal   Renee Sandoval  Time Spent on Video Visit: 0s

## 2024-03-04 NOTE — Progress Notes (Signed)
 Patient ID:  Renee Sandoval is a 28 y.o. (DOB Nov 25, 1995) female  Provider licensed to provide medical care in the location/state of patient:  Yes Patient Location:   Provider Location: [place]  Encounter took place via 2-way audio technology  Consent:  - Patient's identity was confirmed.  - Medical condition or illness was discussed with the patient/personal representative.  - Current proposed treatment for medical condition or illness was explained to patient/personal representative along with the likely benefits, significant risks and complications associated with the treatment.  - The patient/personal representative verbally authorized treatment to be provided by audio/video, which may include a limited review of patient's current health status, medication or other treatment recommendations, patient education and an opportunity to ask questions about condition and treatment.   Verbal Consent granted: Yes  Impression   No diagnosis found.  Plan   Assessment & Plan 1. Weight management. - Symptoms likely related to [facility] injection, causing constipation; iron supplement may also contribute. - Advised that side effects often subside after a few months as the body adjusts to the medication. - Prescription for Wegovy 0.5 mg to be taken for 4 weeks; advised daily MiraLAX, fiber supplements, stool softeners, and maintaining adequate hydration. - Patient to contact the office upon completion of the 0.5 mg dose for further instructions; follow-up appointment scheduled for 3 months from now.  2. Constipation  -Discussed side effect of iron and GLP1. -Advised daily fiber, miralax prn and stool softeners -Drink >60oz of water  per day   No orders of the defined types were placed in this encounter.   Current Outpatient Medications  Medication Instructions  . albuterol  HFA (PROVENTIL  HFA;VENTOLIN  HFA;PROAIR  HFA) 90 mcg/actuation inhaler 2 puffs, inhalation, Every 6 hours PRN  .  ferrous sulfate 325 mg, Daily before breakfast  . Wegovy 0.25 mg, subcutaneous, Every 7 days     Risks, benefits, and alternatives of the medications and treatment plan prescribed today were discussed, and patient expressed understanding. Plan follow-up as discussed or as needed if any worsening symptoms or change in condition.    Subjective   Chief Complaint  Patient presents with  . Follow-up  . Headache    X 2 WKS  . Constipation   History of Present Illness The patient is a 28 year old female who presents via virtual visit today for follow-up on her weight loss injections. She is currently taking Wegovy 0.25 mg weekly.  She has completed her course of Wegovy 0.25 mg last Friday and is prepared to increase the dosage to 0.5 mg. She expresses concern about potential insurance coverage issues related to this change. Her current regimen also includes iron tablets and a multivitamin.    Past Medical History, Past Surgery History, Allergies, Social History, and Family History were reviewed and updated.    Review of Systems is complete and negative except as noted.  Objective   General:  Well developed, Well nourished, No distress, Non toxic or ill appearing  Psych: Mood, thought content, behavior and judgment normal   Renee Sandoval

## 2024-03-18 ENCOUNTER — Other Ambulatory Visit: Payer: Self-pay

## 2024-03-18 ENCOUNTER — Emergency Department (HOSPITAL_COMMUNITY)

## 2024-03-18 ENCOUNTER — Emergency Department (HOSPITAL_COMMUNITY)
Admission: EM | Admit: 2024-03-18 | Discharge: 2024-03-18 | Disposition: A | Discharge status: Home / Self Care | Attending: Emergency Medicine | Admitting: Emergency Medicine

## 2024-03-18 DIAGNOSIS — I10 Essential (primary) hypertension: Secondary | ICD-10-CM | POA: Diagnosis not present

## 2024-03-18 DIAGNOSIS — R42 Dizziness and giddiness: Secondary | ICD-10-CM | POA: Diagnosis present

## 2024-03-18 DIAGNOSIS — M79602 Pain in left arm: Secondary | ICD-10-CM | POA: Diagnosis not present

## 2024-03-18 LAB — COMPREHENSIVE METABOLIC PANEL WITH GFR
ALT: 16 U/L (ref 0–44)
AST: 17 U/L (ref 15–41)
Albumin: 3.7 g/dL (ref 3.5–5.0)
Alkaline Phosphatase: 52 U/L (ref 38–126)
Anion gap: 11 (ref 5–15)
BUN: 8 mg/dL (ref 6–20)
CO2: 23 mmol/L (ref 22–32)
Calcium: 9.3 mg/dL (ref 8.9–10.3)
Chloride: 106 mmol/L (ref 98–111)
Creatinine, Ser: 0.91 mg/dL (ref 0.44–1.00)
GFR, Estimated: >60 mL/min (ref >60)
Glucose, Bld: 102 mg/dL — ABNORMAL HIGH (ref 70–99)
Potassium: 4 mmol/L (ref 3.5–5.1)
Sodium: 140 mmol/L (ref 135–145)
Total Bilirubin: 1 mg/dL (ref 0.0–1.2)
Total Protein: 7.4 g/dL (ref 6.5–8.1)

## 2024-03-18 LAB — URINALYSIS, ROUTINE W REFLEX MICROSCOPIC
Bacteria, UA: NONE SEEN
Bilirubin Urine: NEGATIVE
Glucose, UA: NEGATIVE mg/dL
Hgb urine dipstick: NEGATIVE
Ketones, ur: NEGATIVE mg/dL
Nitrite: NEGATIVE
Protein, ur: NEGATIVE mg/dL
Specific Gravity, Urine: 1.016 (ref 1.005–1.030)
pH: 5 (ref 5.0–8.0)

## 2024-03-18 LAB — PREGNANCY, URINE: Preg Test, Ur: NEGATIVE

## 2024-03-18 LAB — TROPONIN I (HIGH SENSITIVITY): Troponin I (High Sensitivity): <2 ng/L (ref <18)

## 2024-03-18 MED ORDER — NITROFURANTOIN MONOHYD MACRO 100 MG PO CAPS
100.0000 mg | ORAL_CAPSULE | Freq: Two times a day (BID) | ORAL | 0 refills | Status: AC
Start: 2024-03-18 — End: ?

## 2024-03-18 MED ORDER — NITROFURANTOIN MONOHYD MACRO 100 MG PO CAPS
100.0000 mg | ORAL_CAPSULE | Freq: Once | ORAL | Status: AC
  Administered 2024-03-18: 100 mg via ORAL
  Filled 2024-03-18: qty 1

## 2024-03-18 NOTE — ED Triage Notes (Signed)
 Patient comes in for left shoulder pain, light headedness and elevated blood pressure.  Started around 9am  Patient complains of nausea.  Denies chest pain

## 2024-03-18 NOTE — ED Provider Notes (Signed)
 12:22 PM Patient seen in conjunction with Bundy PA-C.  Patient presents with intermittent left shoulder pain, worse with movement, occurring for several days but worse this morning.  No associated shortness of breath.  She felt lightheaded but did not get tunnel vision or pass out.  She needed to sit down during this episode.  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 Patient denies risk factors for pulmonary embolism including: unilateral leg swelling, history of DVT/PE/other blood clots, use of exogenous hormones, recent immobilizations, recent surgery, recent travel (>4hr segment), malignancy, hemoptysis.   Pending results of work-up. Trop was negative.  She is PERC negative, low clinical concern for DVT/PE.  Symptoms seem musculoskeletal in nature.  Overall low concern for ACS/MI.  BP (!) 163/89   Pulse 87   Temp 98.5 F (36.9 C) (Oral)   Resp 18   Ht 5' 5 (1.651 m)   Wt 120.2 kg   LMP 03/05/2024   SpO2 100%   BMI 44.10 kg/m       Desiderio Chew, PA-C 03/18/24 1224    Elnor Savant A, DO 03/18/24 1328

## 2024-03-18 NOTE — ED Provider Notes (Signed)
 [place] EMERGENCY DEPARTMENT AT [facility] Provider Note   CSN: [id number] Arrival date & time: 03/18/24  1022     Patient presents with: Dizziness   Renee Sandoval is a 28 y.o. female.   28 y/o Female presents in ED with left arm/shoulder pain, dizziness, headache, nausea, and HTN. Symptoms began this morning when she woke up but reports the dizziness didn't start until she was at work. Dizziness is exacerbated by walking but reports it is present while sitting as well. Pt reports she has been on [facility] for 2 months and just recently increased the dose, nausea has been a consistent side effect during her tx with [facility]. HTN is a new sx and she checks her BP regularly d/t family hx. Pt denies oral contraception, recent travel, recent sickness, or hx of PE. Pt reports lately she has not been drinking fluids like she normally would and    Dizziness Associated symptoms: headaches and nausea        Prior to Admission medications   Medication Sig Start Date End Date Taking? Authorizing Provider  nitrofurantoin , macrocrystal-monohydrate, (MACROBID ) 100 MG capsule Take 1 capsule (100 mg total) by mouth 2 (two) times daily. 03/18/24  Yes Myriam Fonda RAMAN, PA-C  amoxicillin -clavulanate (AUGMENTIN ) 875-125 MG tablet Take 1 tablet by mouth every 12 (twelve) hours. 05/03/23   Trine Raynell Moder, MD    Allergies: Patient has no known allergies.    Review of Systems  Constitutional:  Positive for appetite change. Negative for fatigue and fever.  Gastrointestinal:  Positive for nausea.  Genitourinary:  Positive for dysuria.  Neurological:  Positive for dizziness and headaches. Negative for facial asymmetry.    Updated Vital Signs BP (!) 163/89   Pulse 87   Temp 98.5 F (36.9 C) (Oral)   Resp 18   Ht 5' 5 (1.651 m)   Wt 120.2 kg   LMP 03/05/2024   SpO2 100%   BMI 44.10 kg/m   Physical Exam Constitutional:      Appearance: Normal appearance.  HENT:     Head:  Normocephalic and atraumatic.  Cardiovascular:     Rate and Rhythm: Normal rate.     Pulses: Normal pulses.  Pulmonary:     Effort: Pulmonary effort is normal.     Breath sounds: Normal breath sounds.  Musculoskeletal:        General: Normal range of motion.  Skin:    General: Skin is warm and dry.  Neurological:     General: No focal deficit present.     Mental Status: She is alert and oriented to person, place, and time.     Gait: Gait normal.  Psychiatric:        Mood and Affect: Mood normal.     (all labs ordered are listed, but only abnormal results are displayed) Labs Reviewed  COMPREHENSIVE METABOLIC PANEL WITH GFR - Abnormal; Notable for the following components:      Result Value   Glucose, Bld 102 (*)    All other components within normal limits  URINALYSIS, ROUTINE W REFLEX MICROSCOPIC - Abnormal; Notable for the following components:   Leukocytes,Ua TRACE (*)    All other components within normal limits  PREGNANCY, URINE  TROPONIN I (HIGH SENSITIVITY)    EKG: Normal Sinus Rhythm   Radiology: CXR: No active cardiopulmonary disease.    Procedures   Medications Ordered in the ED  nitrofurantoin  (macrocrystal-monohydrate) (MACROBID ) capsule 100 mg (100 mg Oral Given 03/18/24 1309)  Clinical Course as of 03/18/24 1510  Tue Mar 18, 2024  1056 ED EKG [JB]  1254 DG Chest 2 View [JB]    Clinical Course User Index [JB] Myriam Fonda GORMAN DEVONNA                                 Medical Decision Making 28 y/o Female presenting left arm pain, nausea, dizziness, and HTN. Pt reported arm pain began when she woke up but dizziness began at work when she was passing out medications. Pt denies chest pain, shortness of breath, vomiting, diaphoresis, syncope, or palpitations. Pt reported pain while urinating during ROS. Differential includes: Cardiac arrhthymias, cardiac ischemia, stroke, PE, UTI, dizziness  Pt denies recent travel, OCP, or hormone therapy. Pt is PERC  negative. Pt has no deficits on neurologic exam and is able to ambulate without assistance. EKG showed sinus rhythm. Troponin's came back negative. CMP showed no significant abnormalities. UA showed some leukocytes and pt reported sx of UTI.   Treatment plan discussed with pt and she agreed to start antibiotics here then pick up rx later today. Pt advised to f/u with PCP if sx get worse or no relief in one week. If dizziness gets worse or chest pain starts pt was advised to return to ED.   Amount and/or Complexity of Data Reviewed Labs: ordered. Radiology: ordered. Decision-making details documented in ED Course. ECG/medicine tests:  Decision-making details documented in ED Course.  Risk Prescription drug management.       Final diagnoses:  Lightheadedness    ED Discharge Orders          Ordered    nitrofurantoin , macrocrystal-monohydrate, (MACROBID ) 100 MG capsule  2 times daily        03/18/24 1302               Myriam Fonda GORMAN, [place] 03/18/24 1513    Elnor Jayson LABOR, DO 03/19/24 [phone]
